# Patient Record
Sex: Female | Born: 1940 | Race: White | Hispanic: No | Smoking: Never smoker
Health system: Southern US, Community
[De-identification: ages and names within clinical notes are randomized; demographics above are authoritative.]

## PROBLEM LIST (undated history)

## (undated) DIAGNOSIS — Z8679 Personal history of other diseases of the circulatory system: Secondary | ICD-10-CM

## (undated) DIAGNOSIS — Z0000 Encounter for general adult medical examination without abnormal findings: Secondary | ICD-10-CM

## (undated) DIAGNOSIS — E785 Hyperlipidemia, unspecified: Secondary | ICD-10-CM

## (undated) HISTORY — DX: Hyperlipidemia, unspecified: E78.5

## (undated) HISTORY — DX: Personal history of other diseases of the circulatory system: Z86.79

## (undated) HISTORY — DX: Encounter for general adult medical examination without abnormal findings: Z00.00

---

## 1997-06-21 HISTORY — PX: APPENDECTOMY: SHX54

## 1998-11-04 ENCOUNTER — Encounter (INDEPENDENT_AMBULATORY_CARE_PROVIDER_SITE_OTHER): Payer: Self-pay | Admitting: Gastroenterology

## 1999-08-27 ENCOUNTER — Encounter (INDEPENDENT_AMBULATORY_CARE_PROVIDER_SITE_OTHER): Payer: Self-pay | Admitting: Specialist

## 1999-08-27 ENCOUNTER — Other Ambulatory Visit: Admission: RE | Admit: 1999-08-27 | Discharge: 1999-08-27 | Payer: Self-pay | Admitting: Gastroenterology

## 2004-05-04 ENCOUNTER — Ambulatory Visit: Payer: Self-pay | Admitting: Gastroenterology

## 2004-05-11 ENCOUNTER — Ambulatory Visit: Payer: Self-pay | Admitting: Gastroenterology

## 2007-09-04 ENCOUNTER — Ambulatory Visit: Payer: Self-pay | Admitting: Internal Medicine

## 2007-09-06 DIAGNOSIS — E785 Hyperlipidemia, unspecified: Secondary | ICD-10-CM | POA: Insufficient documentation

## 2007-09-06 DIAGNOSIS — N816 Rectocele: Secondary | ICD-10-CM | POA: Insufficient documentation

## 2007-09-06 DIAGNOSIS — Z8679 Personal history of other diseases of the circulatory system: Secondary | ICD-10-CM | POA: Insufficient documentation

## 2007-09-06 DIAGNOSIS — D126 Benign neoplasm of colon, unspecified: Secondary | ICD-10-CM | POA: Insufficient documentation

## 2008-01-04 ENCOUNTER — Ambulatory Visit (HOSPITAL_COMMUNITY): Admission: RE | Admit: 2008-01-04 | Discharge: 2008-01-04 | Payer: Self-pay | Admitting: Cardiovascular Disease

## 2008-10-14 ENCOUNTER — Encounter: Payer: Self-pay | Admitting: Cardiovascular Disease

## 2008-10-14 LAB — CONVERTED CEMR LAB
AST: 25 units/L
Alkaline Phosphatase: 46 units/L
Calcium: 9.5 mg/dL
Chloride: 105 meq/L
Cholesterol: 198 mg/dL
Glucose, Bld: 94 mg/dL
HCT: 40.1 %
Hemoglobin: 14.4 g/dL
LDL Cholesterol: 114.2 mg/dL
MCV: 92.2 fL
Monocytes Relative: 7.4 %
Potassium: 4.8 meq/L
RBC: 4.35 M/uL

## 2009-03-26 ENCOUNTER — Encounter (INDEPENDENT_AMBULATORY_CARE_PROVIDER_SITE_OTHER): Payer: Self-pay | Admitting: *Deleted

## 2009-04-28 ENCOUNTER — Encounter: Payer: Self-pay | Admitting: Cardiovascular Disease

## 2009-07-08 ENCOUNTER — Encounter: Payer: Self-pay | Admitting: Cardiovascular Disease

## 2009-10-22 ENCOUNTER — Encounter: Payer: Self-pay | Admitting: Cardiovascular Disease

## 2009-10-31 ENCOUNTER — Telehealth: Payer: Self-pay | Admitting: Internal Medicine

## 2009-11-05 ENCOUNTER — Telehealth: Payer: Self-pay | Admitting: Internal Medicine

## 2009-11-19 ENCOUNTER — Ambulatory Visit: Payer: Self-pay | Admitting: Cardiovascular Disease

## 2009-11-20 ENCOUNTER — Telehealth: Payer: Self-pay | Admitting: Cardiovascular Disease

## 2009-11-24 ENCOUNTER — Encounter: Payer: Self-pay | Admitting: Cardiovascular Disease

## 2010-01-01 ENCOUNTER — Encounter: Payer: Self-pay | Admitting: Cardiovascular Disease

## 2010-07-21 NOTE — Progress Notes (Signed)
Summary: Schedule Colonoscopy  Phone Note Outgoing Call Call back at [facility] Phone [phone]   Call placed by: Harlow Mares CMA Duncan Dull),  Oct 31, 2009 2:57 PM Call placed to: Patient Summary of Call: Left message on patients machine to call back. patient due for colonoscopy Initial call taken by: Harlow Mares CMA Duncan Dull),  Oct 20, 2009 2:57 PM  Follow-up for Phone Call        Left message on patients machine to call back. patient due for colonoscopy. We will mail a letter to remind patient.  Follow-up by: Harlow Mares CMA Duncan Dull),  Oct 31, 2009 2:59 PM     Appended Document: Schedule Colonoscopy Patient changed care to [facility]

## 2010-07-21 NOTE — Consult Note (Signed)
Summary: OFFICE NOTE SE  OFFICE NOTE SE   Imported By: Park Breed 11/24/2009 09:37:58  _____________________________________________________________________  External Attachment:    Type:   Image     Comment:   External Document

## 2010-07-21 NOTE — Letter (Signed)
Summary: HEALTH HX  HEALTH HX   Imported ByFrazier Butt Chriscoe 11/24/2009 09:41:34  _____________________________________________________________________  External Attachment:    Type:   Image     Comment:   External Document

## 2010-07-21 NOTE — Progress Notes (Signed)
Summary: Going to [facility] for he Colonoscopy from now on  Phone Note Call from Patient Call back at [facility] Phone [phone]   Call For: Dr Juanda Chance Summary of Call: Wants to let us know she already had her Recall Colon at [facility] and will continue to go to [facility] from now on.  Initial call taken by: Leanor Kail [facility],  Nov 05, 2009 11:32 AM  Follow-up for Phone Call        change of care noted. Follow-up by: Lamona Curl CMA Duncan Dull),  Nov 05, 2009 11:36 AM

## 2010-07-21 NOTE — Letter (Signed)
Summary: Letter  Letter   Imported By: West Carbo 12/11/2009 11:57:58  _____________________________________________________________________  External Attachment:    Type:   Image     Comment:   External Document

## 2010-07-21 NOTE — Letter (Signed)
Summary: Medical Record Release  Medical Record Release   Imported By: Harlon Flor 10/29/2009 14:15:20  _____________________________________________________________________  External Attachment:    Type:   Image     Comment:   External Document

## 2010-07-21 NOTE — Assessment & Plan Note (Signed)
Summary: NP6   Visit Type:  Initial Consult Primary Provider:  Aletha Halim, M.D.  CC:  Surgical clearance.  History of Present Illness: Ms. Peralta is a very pleasant 70 year old woman with a strong family history of coronary artery disease (her father), with a coronary calcium score of zero in 2009, history of hyperlipidemia who presents to reestablish care. She was last seen at [facility] in November 2010.  This patient states that she is doing very well. she exercises frequently and watches what she eats. She has been taking cholestyramine for some rectal incontinence and she has a underlying diagnosis of rectal prolapse. She denies any significant shortness of breath, chest pain.  She is due to travel to the [facility] in July of this year for surgery to repair her rectal prolapse.  labs from January 2011 show hematocrit of 40, total cholesterol 187, LDL 102, HDL 75, normal LFTs. CRP 0.4.  NMR lipoprotein of shows LDL particle  number of 839, small LDL particle # of 105 with goal less than 500   Preventive Screening-Counseling & Management  Alcohol-Tobacco     Smoking Status: never  Caffeine-Diet-Exercise     Does Patient Exercise: yes      Drug Use:  no.    Allergies (verified): 1)  ! * Shingles Vaccine  Family History: Family History of Coronary Artery Disease:   Social History: Housewife Married  Tobacco Use - No.  Alcohol Use - yes 4-6 glasses q week Regular Exercise - yes--aerobic/strengh/yoga Drug Use - no Smoking Status:  never Does Patient Exercise:  yes Drug Use:  no  Review of Systems  The patient denies fever, weight loss, weight gain, vision loss, decreased hearing, hoarseness, chest pain, syncope, dyspnea on exertion, peripheral edema, prolonged cough, abdominal pain, incontinence, muscle weakness, depression, and enlarged lymph nodes.    Vital Signs:  Patient profile:   70 year old female Height:      66 inches Weight:       138 pounds BMI:     22.35 Pulse rate:   52 / minute BP sitting:   172 / 94  (right arm) Cuff size:   regular  Vitals Entered By: Bishop Dublin, CMA (November 19, 2009 3:02 PM)  Physical Exam  General:  Well developed, well nourished, in no acute distress. Head:  normocephalic and atraumatic Neck:  Neck supple, no JVD. No masses, thyromegaly or abnormal cervical nodes. Lungs:  Clear bilaterally to auscultation and percussion. Heart:  Non-displaced PMI, chest non-tender; regular rate and rhythm, S1, S2 without murmurs, rubs or gallops. Carotid upstroke normal, no bruit. . Pedals normal pulses. No edema, no varicosities. Abdomen:  Bowel sounds positive; abdomen soft and non-tender without masses, organomegaly, or hernias noted. No hepatosplenomegaly. Msk:  Back normal, normal gait. Muscle strength and tone normal. Pulses:  pulses normal in all 4 extremities Extremities:  No clubbing or cyanosis. Neurologic:  Alert and oriented x 3. Skin:  Intact without lesions or rashes. Psych:  Normal affect.   New Orders:     1)  EKG w/ Interpretation (93000)   EKG  Procedure date:  11/19/2009  Findings:      sinus bradycardia with rate 52 beats per minute, no significant ST or T wave changes  Impression & Recommendations:  Problem # 1:  PREVENTIVE HEALTH CARE (ICD-V70.0) Ms. Kundert would be at low risk of any complications for her upcoming rectal prolapse surgery at the [facility]. Her cholesterol is under reasonable  control with diet and exercise alone as well as with cholestyramine. She had a calcium score of zero in 2009 which puts her at low risk.  She is currently on a baby aspirin for stroke and MI prevention. She has asymptomatic bradycardia. blood pressure was well-controlled on recheck during her visit today.  Problem # 2:  MITRAL VALVE PROLAPSE, HX OF (ICD-V12.50) she carries a history of mitral valve prolapse though this has not been confirmed by echocardiography. She has no  murmur on exam. no further workup is indicated at this time.  Orders: EKG w/ Interpretation (93000)  Patient Instructions: 1)  Your physician recommends that you continue on your current medications as directed. Please refer to the Current Medication list given to you today. 2)  Your physician wants you to follow-up in:   1 YEAR  You will receive a reminder letter in the mail two months in advance. If you don't receive a letter, please call our office to schedule the follow-up appointment.

## 2010-07-21 NOTE — Progress Notes (Signed)
Summary: BP  Phone Note Call from Patient Call back at [phone]   Caller: SELF Call For: [facility] Summary of Call: PT WANTS TO KNOW IF SHE SHOULD BE CONCERNED ABOUT HER BP AT THE APPT YESTERDAY Initial call taken by: Harlon Flor,  November 20, 2009 9:09 AM  Follow-up for Phone Call        Per Dr. Mariah Milling pt instructed to take bps several times per day for 4 days-if bps are still elevated to call the office with her results. Follow-up by: Benedict Needy, RN,  November 20, 2009 12:59 PM

## 2010-11-03 NOTE — Assessment & Plan Note (Signed)
[facility]                         GASTROENTEROLOGY OFFICE NOTE   JACQUESE, CASSARINO                       MRN:          [id number]  DATE:09/04/2007                            DOB:          07/18/1940    This patient is a very nice 70 year old white female who is here today  because of stool incontinence.  She feels there is a prolapse of the  rectal tissue through the rectum, especially when she runs or walks.  She has been a very active exercise fanatic, walking or running, on a  daily basis.  Over the past 4 to 5 years, she has noticed some tissue  that prolapses through the rectum but spontaneously reduces itself.  This happens only periodically, not every day.  On occasion she has  stool leaking out without any warning.  She has a usually normal bowel  movement in the morning without any difficulty with evacuation.  Her  mother has a similar problem with leakage of the stool, and her older  sister has the same problem as well.  Ms. Barasch had numerous  colonoscopies by Dr. Victorino Dike who just retired because of family  history of colon cancer in her parents.  First colonoscopy, 1980, was  normal.  Subsequent colonoscopies in 2001 and 2002 showed adenomatous  polyps of the colon, and the last colonoscopy November 2005, she again  had a rather tortuous and redundant sigmoid colon, but no polyps.  She  is due for repeat colonoscopy in November of 2010.   MEDICATIONS:  None.   PAST HISTORY:  Appendectomy 2003.   FAMILY HISTORY:  Positive for colon cancer.   SOCIAL HISTORY:  Married with 2 children.  She does not smoke, drinks  alcohol only occasionally.   REVIEW OF SYSTEMS:  Essentially negative.  She denies any urinary  symptoms or gynecological symptoms.  She has had yearly checkup by Dr.  Reuel Derby at [facility] at [facility] for gynecological followup, last appointment  November 2008.  She did not complain of any rectal problems to him.   PHYSICAL  EXAMINATION:  Blood pressure 130/80, pulse 68 and weight 142  pounds.  She was alert, oriented, and appearing younger than her stated age.  LUNGS:  Clear to auscultation.  COR:  Normal S1, normal S2.  ABDOMEN:  Soft with good muscular support.  No tenderness.  Normoactive  bowel sounds.  No distension.  Liver edge at costal margin.  RECTAL/ANOSCOPIC EXAM:  Shows normal perianal area, markedly decreased  rectal sphincter tone.  Anoscope causes prolapse of the small part of  the rectal ampulla through the rectal os, but reduces itself  spontaneously.  Stool was Hemoccult negative.  Rectal/vaginal wall is  rather generous.   IMPRESSION:  64. A 70 year old white female, physically very active, who is having      symptoms of rectocele and pelvic relaxation with some prolapse of      the rectal tissue.  Because of the running and jogging she does, it      is quite uncomfortable and inconvenient to her and she would like  to have this corrected.  2. Positive family history of colon cancer in a direct relative.  3. Personal history of adenomatous polyp of the colon in 2002 and 2003      with a history of redundant colon.   PLAN:  1. Continue high-fiber diet.  2. Continue physical activity and pelvic floor muscle exercises.  3. I would like to refer her back to Dr. Reuel Derby who may refer her to      Dr. Ferne Coe, general surgery, for assessment of rectal incontinence      and prolapse.  She may benefit from surgical procedure to improve      her bowel function.  4. She will need a colonoscopy next year.  If her surgeons feel that      she needs to have a colonoscopy prior to any repair of her rectum,      we will be happy to do that prior to that.     Hedwig Morton. Juanda Chance, MD  Electronically Signed    DMB/MedQ  DD: 09/04/2007  DT: 09/04/2007  Job #: [id number]   cc:   Marguarite Arbour, MD

## 2010-12-24 ENCOUNTER — Ambulatory Visit: Payer: Self-pay | Admitting: Cardiovascular Disease

## 2011-01-15 ENCOUNTER — Encounter: Payer: Self-pay | Admitting: Cardiovascular Disease

## 2011-01-21 ENCOUNTER — Ambulatory Visit (INDEPENDENT_AMBULATORY_CARE_PROVIDER_SITE_OTHER): Payer: Medicare Other | Admitting: Cardiovascular Disease

## 2011-01-21 ENCOUNTER — Encounter: Payer: Self-pay | Admitting: Cardiovascular Disease

## 2011-01-21 DIAGNOSIS — E785 Hyperlipidemia, unspecified: Secondary | ICD-10-CM

## 2011-01-21 DIAGNOSIS — R42 Dizziness and giddiness: Secondary | ICD-10-CM

## 2011-01-21 DIAGNOSIS — Z8679 Personal history of other diseases of the circulatory system: Secondary | ICD-10-CM

## 2011-01-21 NOTE — Patient Instructions (Signed)
You are doing well. No medication changes were made. We will order a carotid ultrasound for dizziness Please call us if you have new issues that need to be addressed before your next appt.  We will call you for a follow up Appt. In 12 months

## 2011-01-21 NOTE — Assessment & Plan Note (Addendum)
Episodes of dizziness, symptoms suggestive of orthostasis. She is very concerned about underlying carotid disease given her symptoms. We have ordered a carotid ultrasound at her request.  She does have a low heart rate likely secondary to her regular exercise. I've asked her to continue adequate hydration, liberalize her salt intake as she does report a low blood pressure.

## 2011-01-21 NOTE — Progress Notes (Signed)
Patient ID: Kristina Haney, female    DOB: 12-31-1940, 70 y.o.   MRN: [id number]  HPI Comments: Kristina Haney is a very pleasant 70 year old woman with a strong family history of coronary artery disease (her father), with a coronary calcium score of zero in 2009, history of borderline hyperlipidemia who presents 70 routine followup. She has been taking cholestyramine for some rectal incontinence and she has a underlying diagnosis of rectal prolapse. S/p  surgery to repair her rectal prolapse 12/2009.   This patient states that she is doing very well. she exercises frequently and watches what she eats. She denies any significant shortness of breath. She does report a recent episode of chest tightness or burning. She describes it as a "funny feeling in her chest". This occurred while she was in her car and lasted for 5-10 minutes before resolving. She does not have any chest symptoms when she exercises. She exercises on a daily basis. She had a similar sensation 6 months ago.  She also reports that she has some lightheadedness when she stands on a periodic basis. She reports that she does drink significant fluids. She does not pass out. She has to stop what she is doing for her symptoms resolved and then she can proceed with walking. She is very concerned about her carotid arteries.    labs from January 2011 show hematocrit of 40, total cholesterol 187, LDL 102, HDL 75, normal LFTs. CRP 0.4.  NMR lipoprotein of shows LDL particle  number of 839, small LDL particle # of 105 with goal less than 500  Most recent blood work from July of this year showed total cholesterol 187, LDL 106, HDL 65, CRP 0.5.  EKG shows normal sinus rhythm with rate 54 beats per minute with no significant ST or T wave changes      Outpatient Encounter Prescriptions as of 8/68/2012  Medication Sig Dispense Refill  . aspirin 81 MG tablet Take 81 mg by mouth daily.        . Biotin 1000 MCG tablet Take 1,000 mcg by mouth 3 (three)  times daily.        . Calcium Carbonate-Vit D-Min 600-400 MG-UNIT TABS Take by mouth daily.        . Cholecalciferol (VITAMIN D3) 2000 UNITS capsule Take 2,000 Units by mouth daily.        . Fish Oil OIL by Does not apply route daily.        . Flax OIL Take by mouth daily.        . vitamin E 400 UNIT capsule Take 400 Units by mouth daily.          Review of Systems  Constitutional: Negative.   HENT: Negative.   Eyes: Negative.   Respiratory: Negative.   Cardiovascular: Positive for chest pain.  Gastrointestinal: Negative.   Musculoskeletal: Negative.   Skin: Negative.   Neurological: Positive for dizziness.  Hematological: Negative.   Psychiatric/Behavioral: Negative.   All other systems reviewed and are negative.    BP 133/89  Pulse 59  Ht 5\' 6"  (1.676 m)  Wt 135 lb (61.236 kg)  BMI 21.79 kg/m2  Physical Exam  Nursing note and vitals reviewed. Constitutional: She is oriented to person, place, and time. She appears well-developed and well-nourished.  HENT:  Head: Normocephalic.  Nose: Nose normal.  Mouth/Throat: Oropharynx is clear and moist.  Eyes: Conjunctivae are normal. Pupils are equal, round, and reactive to light.  Neck: Normal range of motion. Neck supple.  No JVD present.  Cardiovascular: Normal rate, regular rhythm, S1 normal, S2 normal, normal heart sounds and intact distal pulses.  Exam reveals no gallop and no friction rub.   No murmur heard. Pulmonary/Chest: Effort normal and breath sounds normal. No respiratory distress. She has no wheezes. She has no rales. She exhibits no tenderness.  Abdominal: Soft. Bowel sounds are normal. She exhibits no distension. There is no tenderness.  Musculoskeletal: Normal range of motion. She exhibits no edema and no tenderness.  Lymphadenopathy:    She has no cervical adenopathy.  Neurological: She is alert and oriented to person, place, and time. Coordination normal.  Skin: Skin is warm and dry. No rash noted. No erythema.   Psychiatric: She has a normal mood and affect. Her behavior is normal. Judgment and thought content normal.         Assessment and Plan

## 2011-01-21 NOTE — Assessment & Plan Note (Signed)
Questionable mitral valve prolapse history. No clear mitral regurgitation or signs of heart failure from MR on clinical exam. We will hold off on repeat echocardiogram at this time.

## 2011-01-21 NOTE — Assessment & Plan Note (Signed)
Blood pressure is adequate though slightly elevated from previous numbers last year. She does have a strong family history though no coronary calcification seen on CT scan 3 years ago.

## 2011-01-26 ENCOUNTER — Encounter: Payer: PRIVATE HEALTH INSURANCE | Admitting: *Deleted

## 2011-02-09 ENCOUNTER — Encounter (INDEPENDENT_AMBULATORY_CARE_PROVIDER_SITE_OTHER): Payer: Medicare Other | Admitting: *Deleted

## 2011-02-09 DIAGNOSIS — R42 Dizziness and giddiness: Secondary | ICD-10-CM

## 2011-02-16 ENCOUNTER — Encounter: Payer: Self-pay | Admitting: Cardiovascular Disease

## 2011-08-10 ENCOUNTER — Ambulatory Visit: Payer: Self-pay | Admitting: Internal Medicine

## 2011-12-31 ENCOUNTER — Encounter: Payer: Self-pay | Admitting: Internal Medicine

## 2012-01-05 ENCOUNTER — Telehealth: Payer: Self-pay | Admitting: Internal Medicine

## 2012-01-05 NOTE — Telephone Encounter (Signed)
Transferred GI Care to [facility].

## 2012-04-21 ENCOUNTER — Ambulatory Visit: Payer: Self-pay | Admitting: Internal Medicine

## 2012-12-26 IMAGING — RF DG UGI W/O KUB
5 of 6 series · 15 of 20 positions shown · non-contrast
Comparison: none

REASON FOR EXAM: reflux  hoarseness
COMMENTS:

PROCEDURE:     FL  - FL UPPER GI  - August 10, 2011  [DATE]
RESULT:
PROCEDURE:  The patient was given effervescent crystals followed by
administration of oral barium. Spot images of the upper gastrointestinal
tract were obtained.

[Series 1: fluoro_barium 2fps_bw · 0.17mm/px · 1 of 1 slices shown (1 of 5)]
[im 1/1]
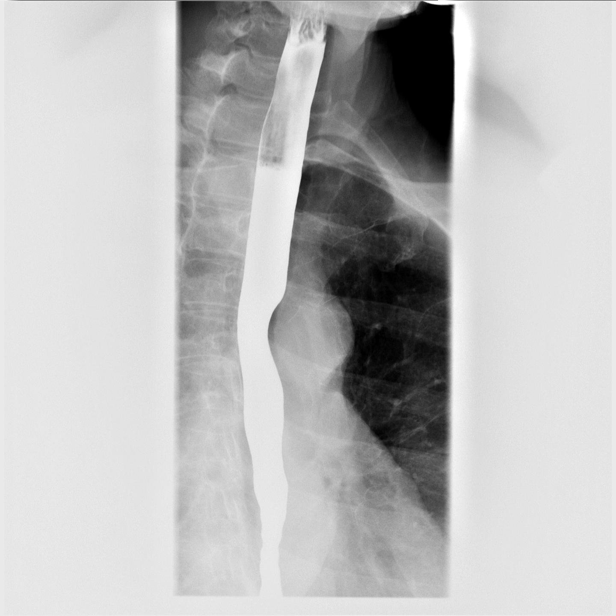

[Series 3: fluoro_barium 2fps_bw · 0.17mm/px · 1 of 1 slices shown (2 of 5)]
[im 1/1]
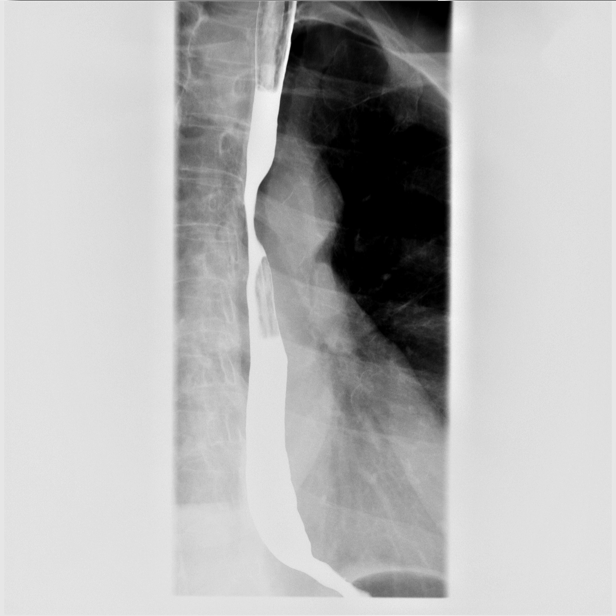

[Series 4: fluoro_barium 2fps_bw · 0.17mm/px · 1 of 1 slices shown (3 of 5)]
[im 1/1]
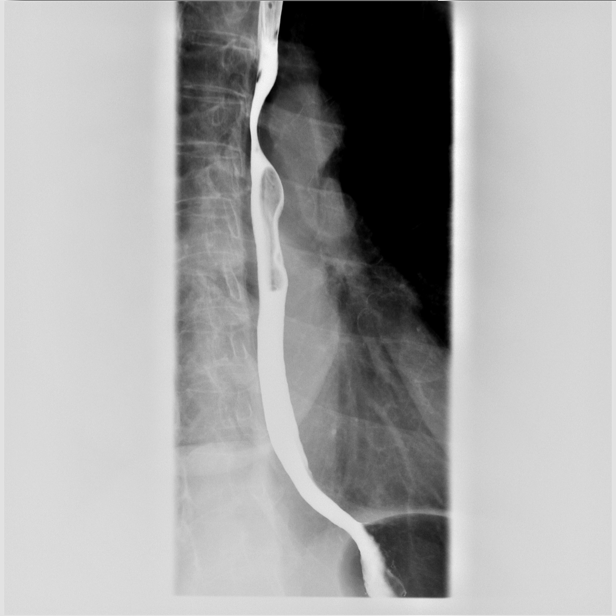

[Series 5: fluoro_barium 2fps_bw · 0.17mm/px · 1 of 2 frames shown (4 of 5)]
[frame 1/2]
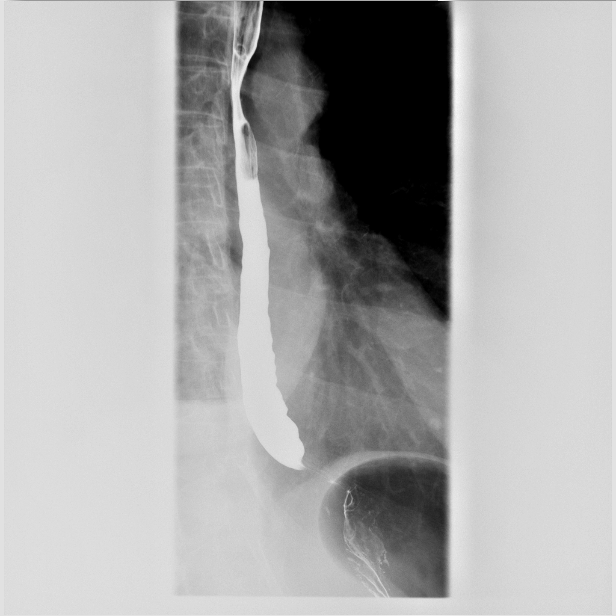

[Series 6: fluoro_barium 2fps_bw · 0.19mm/px · 11 of 14 slices shown (5 of 5)]
[im 1/14]
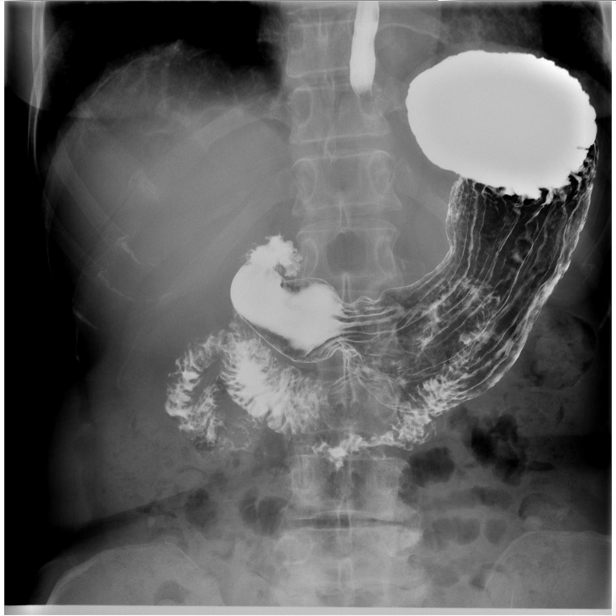
[im 2/14]
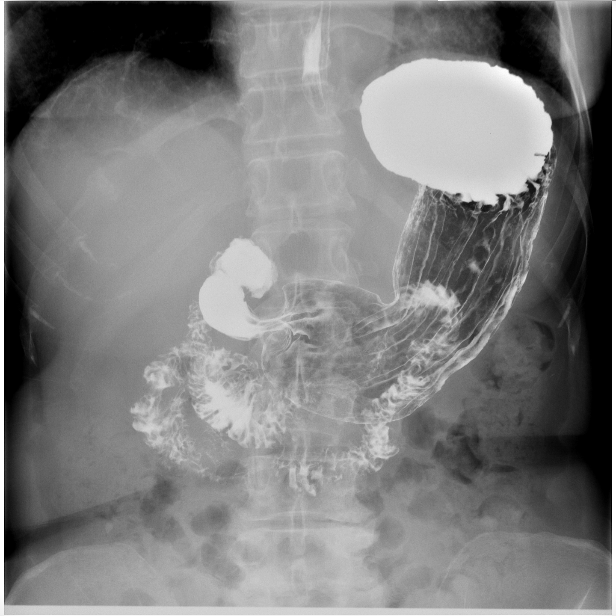
[im 3/14]
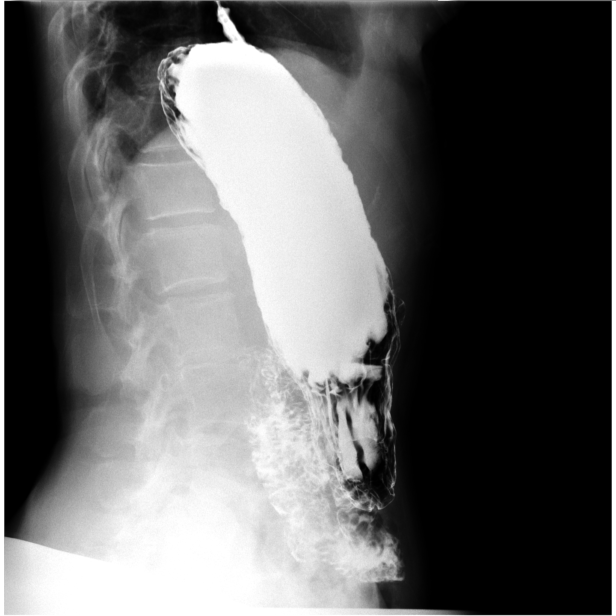
[im 5/14]
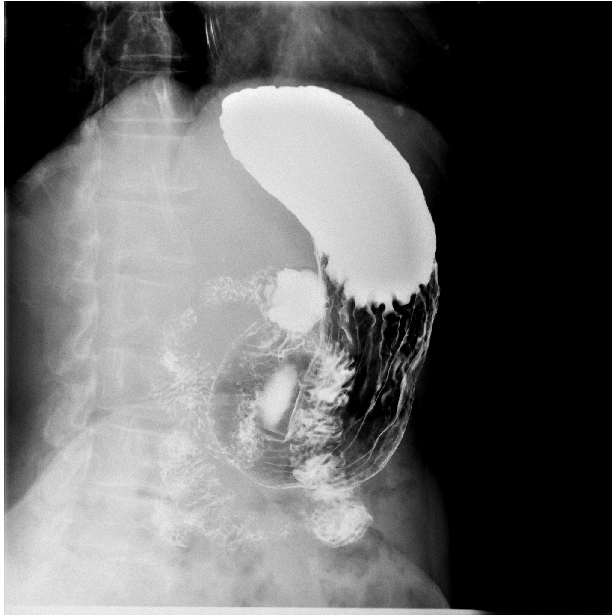
[im 6/14]
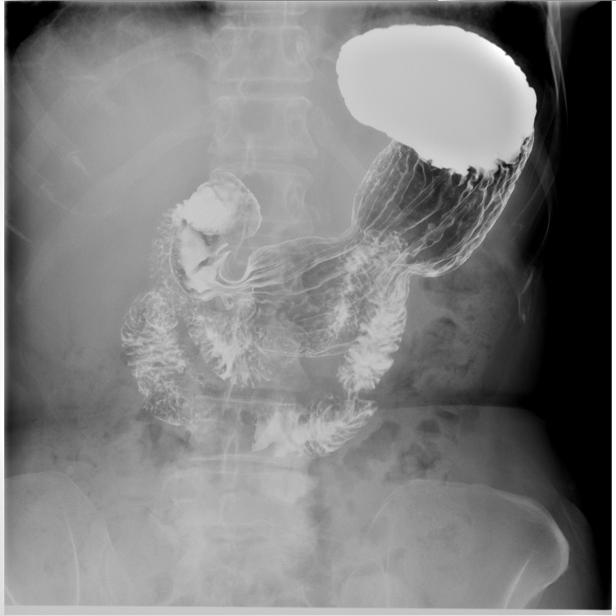
[im 7/14]
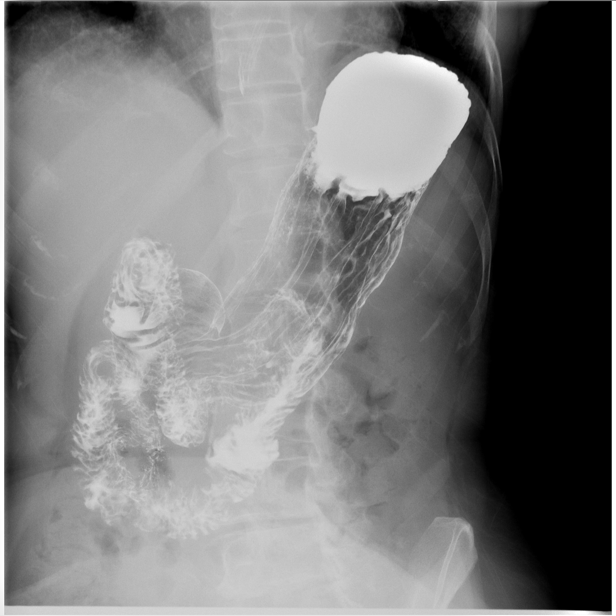
[im 9/14]
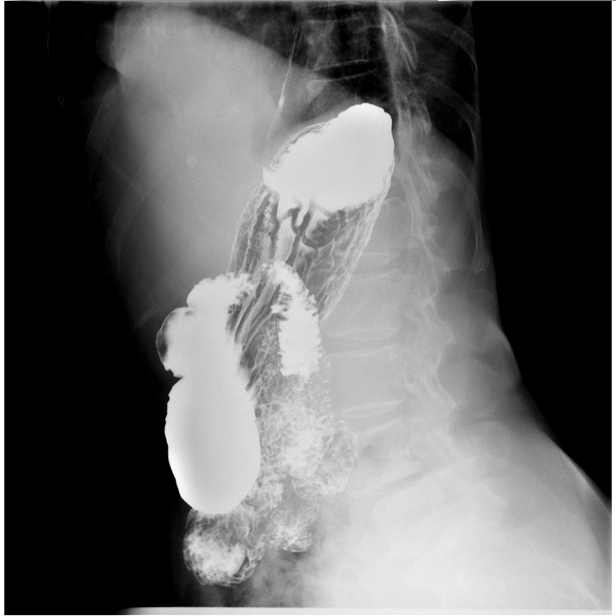
[im 10/14]
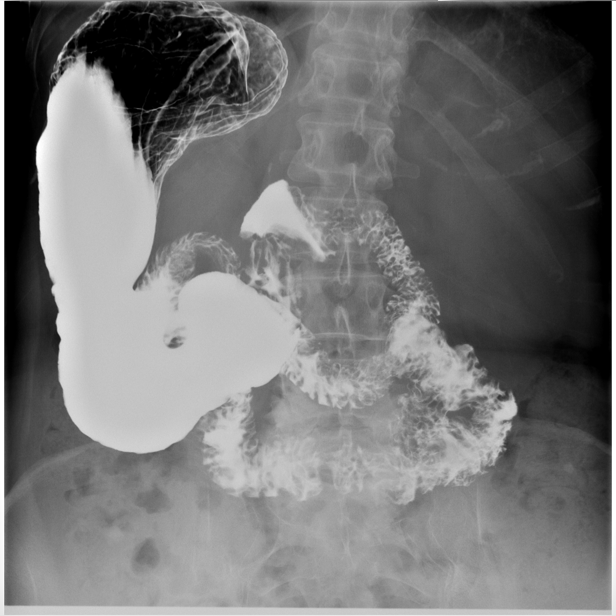
[im 11/14]
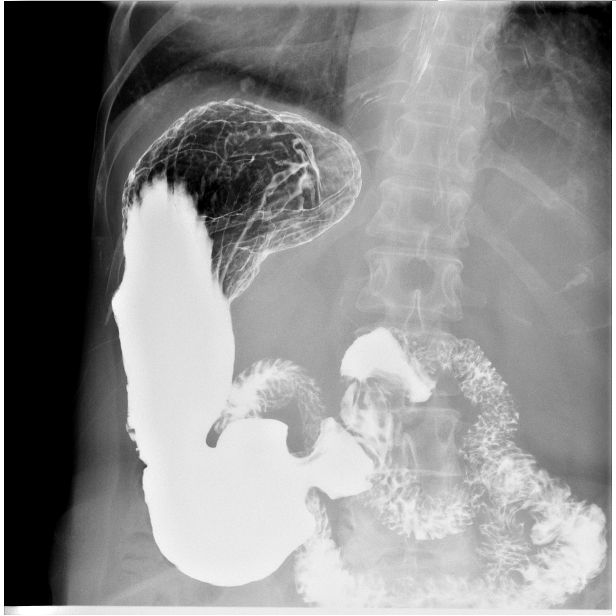
[im 13/14]
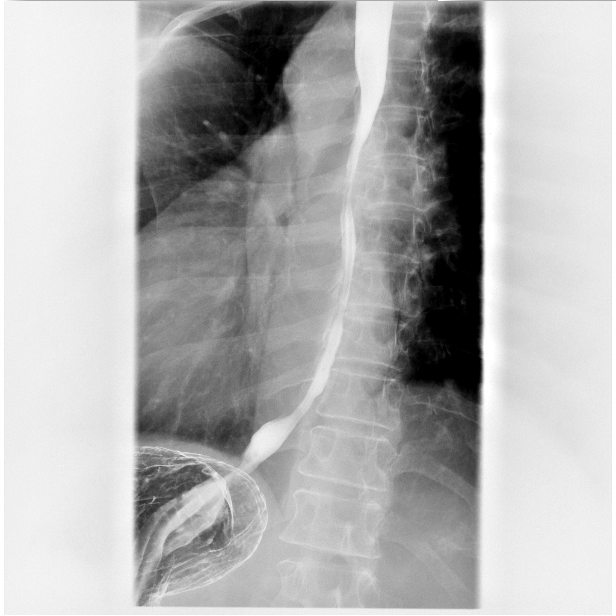
[im 14/14]
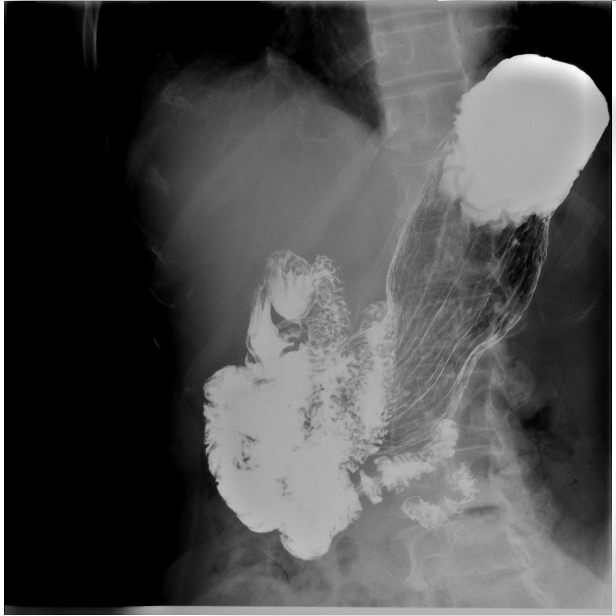

[15 of 20 positions shown; findings below may reference images not displayed]

FINDINGS: Limited evaluation of the esophagus is unremarkable. There is
appropriate relaxation of the lower esophageal sphincter. The stomach,
duodenum, and duodenal bulb are unremarkable. There is appropriate rotation
of the duodenum and replacement of the ligament of Treitz. There is no
evidence of gastroesophageal reflux or hiatal hernia.
IMPRESSION: Unremarkable Upper GI evaluation.

## 2013-01-29 ENCOUNTER — Ambulatory Visit (INDEPENDENT_AMBULATORY_CARE_PROVIDER_SITE_OTHER): Payer: Medicare Other | Admitting: Cardiovascular Disease

## 2013-01-29 ENCOUNTER — Encounter: Payer: Self-pay | Admitting: Cardiovascular Disease

## 2013-01-29 VITALS — BP 120/70 | HR 54 | Ht 66.0 in | Wt 137.5 lb

## 2013-01-29 DIAGNOSIS — Z0000 Encounter for general adult medical examination without abnormal findings: Secondary | ICD-10-CM

## 2013-01-29 DIAGNOSIS — E785 Hyperlipidemia, unspecified: Secondary | ICD-10-CM

## 2013-01-29 DIAGNOSIS — Z8679 Personal history of other diseases of the circulatory system: Secondary | ICD-10-CM

## 2013-01-29 NOTE — Assessment & Plan Note (Signed)
Calcium score is 0. Cholesterol is adequate. No significant carotid arterial disease on review of recent records. Negative stress test/stress echo. Other lab work looks essentially benign. Encouraged her to continue what she is doing with her high-level of exercise

## 2013-01-29 NOTE — Assessment & Plan Note (Signed)
She has records of stress echo showing no ischemia. Trace MR

## 2013-01-29 NOTE — Progress Notes (Signed)
Patient ID: AYLLA HUFFINE, female    DOB: Mar 08, 1941, 72 y.o.   MRN: [id number]  HPI Comments: Ms. Melberg is a very pleasant 72 year old woman with a strong family history of coronary artery disease (her father), with a coronary calcium score of zero in 2009 and again in 2014 , history of borderline hyperlipidemia who presents for routine followup.  underlying diagnosis of rectal prolapse. S/p  surgery to repair her rectal prolapse 12/2009.    This patient states that she is doing very well. she exercises frequently and watches what she eats. She denies any significant shortness of breath.  She does not have any chest symptoms when she exercises. She exercises on a daily basis.   She reports that she had an executive panel done. She brings these records with her today for our review.  This included extensive lab work, imaging. Everything was essentially normal. Total cholesterol 186, HDL 67, LDL 103  She had a stress echo that was normal Carotid ultrasound showed no significant plaque Coronary calcium score was 0   EKG shows normal sinus rhythm with rate 54 beats per minute with no significant ST or T wave changes      Outpatient Encounter Prescriptions as of 01/29/2013  Medication Sig Dispense Refill  . aspirin 81 MG tablet Take 81 mg by mouth daily.        . Calcium Carbonate-Vit D-Min 600-400 MG-UNIT TABS Take by mouth daily.        . Cholecalciferol (VITAMIN D3) 2000 UNITS capsule Take 2,000 Units by mouth daily.        . Fish Oil OIL by Does not apply route daily.        . Flax OIL Take by mouth daily.        Marland Kitchen thiamine (VITAMIN B-1) 100 MG tablet Take 100 mg by mouth daily.      . [DISCONTINUED] Biotin 1000 MCG tablet Take 1,000 mcg by mouth 3 (three) times daily.        . [DISCONTINUED] vitamin E 400 UNIT capsule Take 400 Units by mouth daily.          Review of Systems  Constitutional: Negative.   HENT: Negative.   Eyes: Negative.   Respiratory: Negative.   Cardiovascular:  Negative.   Gastrointestinal: Negative.   Musculoskeletal: Negative.   Skin: Negative.   Neurological: Negative.   Psychiatric/Behavioral: Negative.   All other systems reviewed and are negative.    BP 120/70  Pulse 54  Ht 5\' 6"  (1.676 m)  Wt 137 lb 8 oz (62.37 kg)  BMI 22.2 kg/m2   Physical Exam  Nursing note and vitals reviewed. Constitutional: She is oriented to person, place, and time. She appears well-developed and well-nourished.  HENT:  Head: Normocephalic.  Nose: Nose normal.  Mouth/Throat: Oropharynx is clear and moist.  Eyes: Conjunctivae are normal. Pupils are equal, round, and reactive to light.  Neck: Normal range of motion. Neck supple. No JVD present.  Cardiovascular: Normal rate, regular rhythm, S1 normal, S2 normal, normal heart sounds and intact distal pulses.  Exam reveals no gallop and no friction rub.   No murmur heard. Pulmonary/Chest: Effort normal and breath sounds normal. No respiratory distress. She has no wheezes. She has no rales. She exhibits no tenderness.  Abdominal: Soft. Bowel sounds are normal. She exhibits no distension. There is no tenderness.  Musculoskeletal: Normal range of motion. She exhibits no edema and no tenderness.  Lymphadenopathy:    She has no cervical  adenopathy.  Neurological: She is alert and oriented to person, place, and time. Coordination normal.  Skin: Skin is warm and dry. No rash noted. No erythema.  Psychiatric: She has a normal mood and affect. Her behavior is normal. Judgment and thought content normal.    Assessment and Plan

## 2013-01-29 NOTE — Assessment & Plan Note (Signed)
Encouraged her to stop fish oil and start red yeast rice

## 2013-01-29 NOTE — Patient Instructions (Addendum)
You are doing well. No medication changes were made.  Try Red Yeast Rice for cholesterol  Please call us if you have new issues that need to be addressed before your next appt.  Your physician wants you to follow-up in: 12 months.  You will receive a reminder letter in the mail two months in advance. If you don't receive a letter, please call our office to schedule the follow-up appointment.

## 2013-04-19 ENCOUNTER — Ambulatory Visit: Payer: Self-pay | Admitting: Internal Medicine

## 2013-04-26 ENCOUNTER — Other Ambulatory Visit: Payer: Self-pay

## 2013-05-01 ENCOUNTER — Other Ambulatory Visit (HOSPITAL_COMMUNITY): Payer: Self-pay | Admitting: Urology

## 2013-05-01 DIAGNOSIS — N1330 Unspecified hydronephrosis: Secondary | ICD-10-CM

## 2013-05-07 ENCOUNTER — Telehealth: Payer: Self-pay

## 2013-05-07 NOTE — Telephone Encounter (Signed)
Pt states she dropped lab results from Dr. Judithann Sheen office last week, and wants to make sure Dr Mariah Milling received it. Please call.

## 2013-05-08 ENCOUNTER — Encounter: Payer: Self-pay | Admitting: Cardiovascular Disease

## 2013-05-08 ENCOUNTER — Ambulatory Visit (INDEPENDENT_AMBULATORY_CARE_PROVIDER_SITE_OTHER): Payer: Medicare Other | Admitting: Cardiovascular Disease

## 2013-05-08 VITALS — BP 132/80 | HR 54 | Ht 66.0 in | Wt 137.8 lb

## 2013-05-08 DIAGNOSIS — E785 Hyperlipidemia, unspecified: Secondary | ICD-10-CM

## 2013-05-08 DIAGNOSIS — Z0000 Encounter for general adult medical examination without abnormal findings: Secondary | ICD-10-CM

## 2013-05-08 NOTE — Progress Notes (Signed)
Patient ID: Kristina Haney, female    DOB: 1941/01/13, 72 y.o.   MRN: [id number]  HPI Comments: Kristina Haney is a very pleasant 72 year old woman with a strong family history of coronary artery disease (her father), with a coronary calcium score of zero in 2009 and again in early 2014 , minimal carotid intimal thickening on carotid ultrasound (done outside the office), minimal aortic plaquing,  history of borderline hyperlipidemia who presents for routine followup. diagnosis of rectal prolapse. S/p  surgery to repair her rectal prolapse 12/2009.    This patient states that she is doing very well. she exercises frequently and watches what she eats. She denies any significant shortness of breath.  She does not have any chest symptoms when she exercises. She exercises on a daily basis.   she had an executive panel done. She brings these records with her today for our review.  This included extensive lab work, imaging. Everything was essentially normal. Total cholesterol 186, HDL 67, LDL 103  She had an NMR lipoprofile October 2014 suggested total cholesterol in the 240 range, LDL 130 range, lipoprotein (a) greater than 100. This was much different from her baseline that was done also in October 2014 which she had total cholesterol 190. Possible lab error?  Previous stress echo was normal  Outpatient Encounter Prescriptions as of 05/08/2013  Medication Sig  . Ascorbic Acid (VITAMIN C) 1000 MG tablet Take 1,000 mg by mouth daily.  Marland Kitchen aspirin 81 MG tablet Take 81 mg by mouth daily.    . Biotin 5000 MCG CAPS Take 5,000 mcg by mouth daily.  . Calcium 1500 MG tablet Take 1,500 mg by mouth daily.  . Cholecalciferol (VITAMIN D3) 2000 UNITS capsule Take 2,000 Units by mouth daily.    . Fish Oil OIL 1,000 mg by Does not apply route daily.   . Flax OIL Take by mouth daily.   . [DISCONTINUED] Calcium Carbonate-Vit D-Min 600-400 MG-UNIT TABS Take by mouth daily.    . [DISCONTINUED] thiamine (VITAMIN B-1) 100 MG  tablet Take 100 mg by mouth daily.     Review of Systems  Constitutional: Negative.   HENT: Negative.   Eyes: Negative.   Respiratory: Negative.   Cardiovascular: Negative.   Gastrointestinal: Negative.   Endocrine: Negative.   Musculoskeletal: Negative.   Skin: Negative.   Allergic/Immunologic: Negative.   Neurological: Negative.   Hematological: Negative.   Psychiatric/Behavioral: Negative.   All other systems reviewed and are negative.    BP 132/80  Pulse 54  Ht 5\' 6"  (1.676 m)  Wt 137 lb 12 oz (62.483 kg)  BMI 22.24 kg/m2   Physical Exam  Nursing note and vitals reviewed. Constitutional: She is oriented to person, place, and time. She appears well-developed and well-nourished.  HENT:  Head: Normocephalic.  Nose: Nose normal.  Mouth/Throat: Oropharynx is clear and moist.  Eyes: Conjunctivae are normal. Pupils are equal, round, and reactive to light.  Neck: Normal range of motion. Neck supple. No JVD present.  Cardiovascular: Normal rate, regular rhythm, S1 normal, S2 normal, normal heart sounds and intact distal pulses.  Exam reveals no gallop and no friction rub.   No murmur heard. Pulmonary/Chest: Effort normal and breath sounds normal. No respiratory distress. She has no wheezes. She has no rales. She exhibits no tenderness.  Abdominal: Soft. Bowel sounds are normal. She exhibits no distension. There is no tenderness.  Musculoskeletal: Normal range of motion. She exhibits no edema and no tenderness.  Lymphadenopathy:  She has no cervical adenopathy.  Neurological: She is alert and oriented to person, place, and time. Coordination normal.  Skin: Skin is warm and dry. No rash noted. No erythema.  Psychiatric: She has a normal mood and affect. Her behavior is normal. Judgment and thought content normal.    Assessment and Plan

## 2013-05-08 NOTE — Telephone Encounter (Signed)
Spoke w/ pt.  She is sched to come in this afternoon at 3:45 to discuss cholesterol and possible meds.

## 2013-05-08 NOTE — Assessment & Plan Note (Signed)
As above, we spent significant time talking about her lipids. She will recheck her cholesterol in 6 months time, consider repeat nmr lipoprofile in one years time (although she is relatively low risk).

## 2013-05-08 NOTE — Patient Instructions (Signed)
You are doing well. No medication changes were made.  Please call us if you have new issues that need to be addressed before your next appt.  Your physician wants you to follow-up in: 6 months.  You will receive a reminder letter in the mail two months in advance. If you don't receive a letter, please call our office to schedule the follow-up appointment.   

## 2013-05-08 NOTE — Assessment & Plan Note (Signed)
We have encouraged her to continue her healthy habits of eating and exercise. Imaging has shown no significant high-risk features. Carotids are normal, calcium score normal, aorta with no significant plaquing

## 2013-05-24 ENCOUNTER — Encounter (HOSPITAL_COMMUNITY): Payer: Self-pay

## 2013-05-24 ENCOUNTER — Encounter (HOSPITAL_COMMUNITY)
Admission: RE | Admit: 2013-05-24 | Discharge: 2013-05-24 | Disposition: A | Discharge status: Home / Self Care | Payer: Medicare Other | Source: Ambulatory Visit | Attending: Urology | Admitting: Urology

## 2013-05-24 DIAGNOSIS — N135 Crossing vessel and stricture of ureter without hydronephrosis: Secondary | ICD-10-CM | POA: Insufficient documentation

## 2013-05-24 DIAGNOSIS — N2889 Other specified disorders of kidney and ureter: Secondary | ICD-10-CM | POA: Insufficient documentation

## 2013-05-24 DIAGNOSIS — N1330 Unspecified hydronephrosis: Secondary | ICD-10-CM

## 2013-05-24 MED ORDER — FUROSEMIDE 10 MG/ML IJ SOLN
40.0000 mg | Freq: Once | INTRAMUSCULAR | Status: AC
  Administered 2013-05-24: 31 mg via INTRAVENOUS
  Filled 2013-05-24: qty 4

## 2013-05-24 MED ORDER — TECHNETIUM TC 99M MERTIATIDE
15.5000 | Freq: Once | INTRAVENOUS | Status: AC | PRN
Start: 2013-05-24 — End: 2013-05-24
  Administered 2013-05-24: 15.5 via INTRAVENOUS

## 2013-11-23 ENCOUNTER — Telehealth: Payer: Self-pay | Admitting: Cardiovascular Disease

## 2013-11-23 NOTE — Telephone Encounter (Signed)
Left message for pt to call back  °

## 2013-11-23 NOTE — Telephone Encounter (Signed)
Spoke w/ pt.  Advised pt of Dr. Donivan Scull recommendation.  She is agreeable and will try to make dietary changes before having labs w/ her PCP in a few months.

## 2013-11-23 NOTE — Telephone Encounter (Signed)
Recent lab work Nov 06 2013 showing total cholesterol 208, LDL 120, HDL 75.  LAD the patient know that this is slightly above her baseline which was typically in the 180s. Could consider adding Red Yeast Rice, oatmeal No indication of significant disease by previous calcium scoring and carotid studies Likely do not have to treat this very aggressively given those findings

## 2013-11-23 NOTE — Telephone Encounter (Signed)
Left message on voicemail for pt to call back

## 2014-02-28 ENCOUNTER — Ambulatory Visit: Payer: PRIVATE HEALTH INSURANCE | Admitting: Cardiovascular Disease

## 2014-03-05 ENCOUNTER — Ambulatory Visit: Payer: PRIVATE HEALTH INSURANCE | Admitting: Cardiovascular Disease

## 2014-03-19 ENCOUNTER — Ambulatory Visit: Payer: PRIVATE HEALTH INSURANCE | Admitting: Cardiovascular Disease

## 2014-03-20 ENCOUNTER — Ambulatory Visit (INDEPENDENT_AMBULATORY_CARE_PROVIDER_SITE_OTHER): Payer: Medicare Other | Admitting: Cardiovascular Disease

## 2014-03-20 ENCOUNTER — Encounter: Payer: Self-pay | Admitting: Cardiovascular Disease

## 2014-03-20 VITALS — BP 136/86 | HR 56 | Ht 66.0 in | Wt 135.8 lb

## 2014-03-20 DIAGNOSIS — Z0000 Encounter for general adult medical examination without abnormal findings: Secondary | ICD-10-CM

## 2014-03-20 DIAGNOSIS — E785 Hyperlipidemia, unspecified: Secondary | ICD-10-CM

## 2014-03-20 DIAGNOSIS — Z8679 Personal history of other diseases of the circulatory system: Secondary | ICD-10-CM

## 2014-03-20 NOTE — Assessment & Plan Note (Signed)
No significant prolapse click or murmur appreciated on today's visit

## 2014-03-20 NOTE — Patient Instructions (Signed)
You are doing well. No medication changes were made.  Please call us if you have new issues that need to be addressed before your next appt.  Your physician wants you to follow-up in: 12 months.  You will receive a reminder letter in the mail two months in advance. If you don't receive a letter, please call our office to schedule the follow-up appointment. 

## 2014-03-20 NOTE — Assessment & Plan Note (Addendum)
Currently not on a statin. She will recheck labs with primary care at the end of the year

## 2014-03-20 NOTE — Assessment & Plan Note (Signed)
Overall she is doing very well. Prior calcium score of 0, prior carotid ultrasound with minimal intimal thickening. No further testing needed at this time

## 2014-03-20 NOTE — Progress Notes (Signed)
Patient ID: Kristina Haney, female    DOB: 1940-07-31, 73 y.o.   MRN: [id number]  HPI Comments: Kristina Haney is a very pleasant 73 year old woman with a strong family history of coronary artery disease (her father), with a coronary calcium score of zero in 2009 and again in early 2014 , minimal carotid intimal thickening on carotid ultrasound (done outside the office), minimal aortic plaquing,  history of borderline hyperlipidemia who presents for routine followup. diagnosis of rectal prolapse. S/p  surgery to repair her rectal prolapse 12/2009.    This patient states that she is doing very well. she exercises frequently and watches what she eats. She denies any significant shortness of breath.  She does not have any chest symptoms when she exercises. She exercises on a daily basis.  She is scheduled to have routine lab work done in November 2015 primary care Most recent cholesterol earlier in may 2015 showed total cholesterol 208  She had an NMR lipoprofile October 2014 suggested total cholesterol in the 240 range, LDL 130 range, lipoprotein (a) greater than 100. This was much different from her baseline that was done also in October 2014 which she had total cholesterol 190. Possible lab error?  Previous stress echo was normal EKG shows normal sinus rhythm with no significant ST or T wave changes  Outpatient Encounter Prescriptions as of 03/20/2014  Medication Sig  . Ascorbic Acid (VITAMIN C) 1000 MG tablet Take 1,000 mg by mouth daily.  Marland Kitchen aspirin 81 MG tablet Take 81 mg by mouth daily.    Marland Kitchen b complex vitamins tablet Take 1 tablet by mouth daily.  . Biotin 5000 MCG CAPS Take 5,000 mcg by mouth daily.  . Calcium 1500 MG tablet Take 1,500 mg by mouth daily.  . Cholecalciferol (VITAMIN D3) 2000 UNITS capsule Take 2,000 Units by mouth daily.    . Fish Oil OIL 1,000 mg by Does not apply route daily.   . Flax OIL Take by mouth daily.   . Probiotic Product (ALIGN) 4 MG CAPS Take 4 mg by mouth daily.      Review of Systems  Constitutional: Negative.   HENT: Negative.   Eyes: Negative.   Respiratory: Negative.   Cardiovascular: Negative.   Gastrointestinal: Negative.   Endocrine: Negative.   Musculoskeletal: Negative.   Skin: Negative.   Allergic/Immunologic: Negative.   Neurological: Negative.   Hematological: Negative.   Psychiatric/Behavioral: Negative.   All other systems reviewed and are negative.   BP 136/86  Pulse 56  Ht 5\' 6"  (1.676 m)  Wt 135 lb 12 oz (61.576 kg)  BMI 21.92 kg/m2   Physical Exam  Nursing note and vitals reviewed. Constitutional: She is oriented to person, place, and time. She appears well-developed and well-nourished.  HENT:  Head: Normocephalic.  Nose: Nose normal.  Mouth/Throat: Oropharynx is clear and moist.  Eyes: Conjunctivae are normal. Pupils are equal, round, and reactive to light.  Neck: Normal range of motion. Neck supple. No JVD present.  Cardiovascular: Normal rate, regular rhythm, S1 normal, S2 normal, normal heart sounds and intact distal pulses.  Exam reveals no gallop and no friction rub.   No murmur heard. Pulmonary/Chest: Effort normal and breath sounds normal. No respiratory distress. She has no wheezes. She has no rales. She exhibits no tenderness.  Abdominal: Soft. Bowel sounds are normal. She exhibits no distension. There is no tenderness.  Musculoskeletal: Normal range of motion. She exhibits no edema and no tenderness.  Lymphadenopathy:    She has  no cervical adenopathy.  Neurological: She is alert and oriented to person, place, and time. Coordination normal.  Skin: Skin is warm and dry. No rash noted. No erythema.  Psychiatric: She has a normal mood and affect. Her behavior is normal. Judgment and thought content normal.    Assessment and Plan

## 2014-06-28 ENCOUNTER — Ambulatory Visit: Payer: Self-pay | Admitting: Internal Medicine

## 2014-12-16 ENCOUNTER — Other Ambulatory Visit: Payer: Self-pay

## 2015-03-04 ENCOUNTER — Ambulatory Visit: Payer: Medicare Other | Admitting: Cardiovascular Disease

## 2015-04-21 ENCOUNTER — Ambulatory Visit: Payer: Medicare Other | Admitting: Cardiovascular Disease

## 2015-05-01 ENCOUNTER — Other Ambulatory Visit: Payer: Self-pay | Admitting: Internal Medicine

## 2015-05-01 DIAGNOSIS — R1084 Generalized abdominal pain: Secondary | ICD-10-CM

## 2015-05-01 DIAGNOSIS — R102 Pelvic and perineal pain: Secondary | ICD-10-CM

## 2015-05-08 ENCOUNTER — Ambulatory Visit: Payer: Medicare Other

## 2015-05-09 ENCOUNTER — Ambulatory Visit: Payer: Medicare Other

## 2015-05-12 ENCOUNTER — Ambulatory Visit
Admission: RE | Admit: 2015-05-12 | Discharge: 2015-05-12 | Disposition: A | Discharge status: Home / Self Care | Payer: Medicare Other | Source: Ambulatory Visit | Attending: Internal Medicine | Admitting: Internal Medicine

## 2015-05-12 DIAGNOSIS — R1084 Generalized abdominal pain: Secondary | ICD-10-CM | POA: Insufficient documentation

## 2015-05-12 DIAGNOSIS — Z9889 Other specified postprocedural states: Secondary | ICD-10-CM | POA: Diagnosis not present

## 2015-05-12 DIAGNOSIS — N289 Disorder of kidney and ureter, unspecified: Secondary | ICD-10-CM | POA: Insufficient documentation

## 2015-05-12 DIAGNOSIS — R102 Pelvic and perineal pain: Secondary | ICD-10-CM

## 2015-05-12 DIAGNOSIS — K838 Other specified diseases of biliary tract: Secondary | ICD-10-CM | POA: Diagnosis not present

## 2015-05-12 MED ORDER — IOHEXOL 300 MG/ML  SOLN
50.0000 mL | Freq: Once | INTRAMUSCULAR | Status: AC | PRN
  Administered 2015-05-12: 100 mL via INTRAVENOUS

## 2015-05-20 ENCOUNTER — Ambulatory Visit: Payer: Medicare Other | Admitting: Cardiovascular Disease

## 2015-06-26 ENCOUNTER — Ambulatory Visit: Payer: Medicare Other | Admitting: Cardiovascular Disease

## 2015-07-02 ENCOUNTER — Ambulatory Visit (INDEPENDENT_AMBULATORY_CARE_PROVIDER_SITE_OTHER): Payer: Medicare Other | Admitting: Cardiovascular Disease

## 2015-07-02 ENCOUNTER — Encounter: Payer: Self-pay | Admitting: Cardiovascular Disease

## 2015-07-02 VITALS — BP 130/74 | HR 47 | Ht 66.0 in | Wt 133.5 lb

## 2015-07-02 DIAGNOSIS — Z8679 Personal history of other diseases of the circulatory system: Secondary | ICD-10-CM

## 2015-07-02 DIAGNOSIS — E785 Hyperlipidemia, unspecified: Secondary | ICD-10-CM | POA: Diagnosis not present

## 2015-07-02 DIAGNOSIS — I700 Atherosclerosis of aorta: Secondary | ICD-10-CM

## 2015-07-02 DIAGNOSIS — Z0000 Encounter for general adult medical examination without abnormal findings: Secondary | ICD-10-CM | POA: Diagnosis not present

## 2015-07-02 MED ORDER — ROSUVASTATIN CALCIUM 5 MG PO TABS: 5.0000 mg | ORAL_TABLET | Freq: Every day | ORAL | Status: DC

## 2015-07-02 NOTE — Assessment & Plan Note (Signed)
Total cholesterol 210, LDL 120, Given her Aortic atherosclerosis, we have suggested she treat this more aggressively Will start crestor 5mg  QOD, At a later date could consider increasing up to 5 mg daily

## 2015-07-02 NOTE — Assessment & Plan Note (Signed)
Long discussion going over her labs today, As well as CT scan images

## 2015-07-02 NOTE — Patient Instructions (Signed)
You are doing well.  Consider starting crestor one pill every other day as tolerated  Please call us if you have new issues that need to be addressed before your next appt.  Your physician wants you to follow-up in: 12 months.  You will receive a reminder letter in the mail two months in advance. If you don't receive a letter, please call our office to schedule the follow-up appointment.

## 2015-07-02 NOTE — Assessment & Plan Note (Signed)
CT scan images reviewed with her in detail showing mild, possibly mild to moderate distal aorta disease, Minimal atherosclerotic plaque in the right common iliac Options discussed with her Suggested she could try low-dose Crestor 5 mg, starting every other day She is unclear at this time if she would like to take medication on a daily basis Recommended if she does take Crestor routinely that we check her cholesterol in 3 months time

## 2015-07-02 NOTE — Progress Notes (Signed)
Patient ID: Kristina Haney, female    DOB: 1941/01/07, 75 y.o.   MRN: [id number]  HPI Comments: Kristina Haney is a very pleasant 75 year old woman with a strong family history of coronary artery disease (her father), with a coronary calcium score of zero in 2009 and again in early 2014 , minimal carotid intimal thickening on carotid ultrasound (done outside the office),  history of borderline hyperlipidemia who presents for routine followup of her hyperlipidemia. diagnosis of rectal prolapse. S/p  surgery to repair her rectal prolapse 12/2009.  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 She denies any significant shortness of breath. She exercises on a daily basis.   EKG on today's visit shows sinus bradycardia with rate 47 bpm, no significant ST or T-wave changes  The past medical history Previous stress echo was normal  Allergies  Allergen Reactions  . Iodinated Diagnostic Agents Rash    Can tolerate with a steroid prep  . Iohexol Hives    ivp dye  . Hydrocortisone Rash    Current Outpatient Prescriptions on File Prior to Visit  Medication Sig Dispense Refill  . Ascorbic Acid (VITAMIN C) 1000 MG tablet Take 1,000 mg by mouth daily.    Marland Kitchen aspirin 81 MG tablet Take 81 mg by mouth daily.      Marland Kitchen b complex vitamins tablet Take 1 tablet by mouth daily.    . Biotin 5000 MCG CAPS Take 5,000 mcg by mouth daily.    . Calcium 1500 MG tablet Take 1,500 mg by mouth daily.    . Cholecalciferol  (VITAMIN D3) 2000 UNITS capsule Take 2,000 Units by mouth daily.      . Flax OIL Take by mouth daily.      No current facility-administered medications on file prior to visit.    Past Medical History  Diagnosis Date  . Personal history of unspecified circulatory disease   . Routine general medical examination at a health care facility   . Hyperlipidemia     Past Surgical History  Procedure Laterality Date  . Appendectomy  1999    Social History  reports that she has never smoked. She does not have any smokeless tobacco history on file. She reports that she drinks about 0.6 oz of alcohol per week. She reports that she does not use illicit drugs.  Family History family history includes Heart Problems in her father and mother; Heart attack in her father; Stroke in her mother.    Review of Systems  Constitutional: Negative.   Respiratory: Negative.   Cardiovascular: Negative.   Gastrointestinal: Negative.   Musculoskeletal: Negative.   Allergic/Immunologic: Negative.   Neurological: Negative.   Hematological: Negative.   Psychiatric/Behavioral: Negative.   All other systems reviewed and are negative.   BP 130/74 mmHg  Pulse 47  Ht 5\' 6"  (1.676 m)  Wt 133 lb 8 oz (60.555 kg)  BMI 21.56 kg/m2  Physical Exam  Constitutional: She is oriented to person, place, and time. She appears  well-developed and well-nourished.  thin  HENT:  Head: Normocephalic.  Nose: Nose normal.  Mouth/Throat: Oropharynx is clear and moist.  Eyes: Conjunctivae are normal. Pupils are equal, round, and reactive to light.  Neck: Normal range of motion. Neck supple. No JVD present.  Cardiovascular: Normal rate, regular rhythm, S1 normal, S2 normal, normal heart sounds and intact distal pulses.  Exam reveals no gallop and no friction rub.   No murmur heard. Pulmonary/Chest: Effort normal and breath sounds normal. No respiratory distress. She has no wheezes. She has no rales. She exhibits no  tenderness.  Abdominal: Soft. Bowel sounds are normal. She exhibits no distension. There is no tenderness.  Musculoskeletal: Normal range of motion. She exhibits no edema or tenderness.  Lymphadenopathy:    She has no cervical adenopathy.  Neurological: She is alert and oriented to person, place, and time. Coordination normal.  Skin: Skin is warm and dry. No rash noted. No erythema.  Psychiatric: She has a normal mood and affect. Her behavior is normal. Judgment and thought content normal.    Assessment and Plan  Nursing note and vitals reviewed.

## 2015-09-01 ENCOUNTER — Other Ambulatory Visit (HOSPITAL_COMMUNITY): Payer: Self-pay | Admitting: Urology

## 2015-09-01 DIAGNOSIS — N135 Crossing vessel and stricture of ureter without hydronephrosis: Secondary | ICD-10-CM

## 2015-09-10 ENCOUNTER — Encounter (HOSPITAL_COMMUNITY)
Admission: RE | Admit: 2015-09-10 | Discharge: 2015-09-10 | Disposition: A | Discharge status: Home / Self Care | Payer: Medicare Other | Source: Ambulatory Visit | Attending: Urology | Admitting: Urology

## 2015-09-10 DIAGNOSIS — N135 Crossing vessel and stricture of ureter without hydronephrosis: Secondary | ICD-10-CM

## 2015-09-10 MED ORDER — FUROSEMIDE 10 MG/ML IJ SOLN
INTRAMUSCULAR | Status: AC
  Filled 2015-09-10: qty 4

## 2015-09-10 MED ORDER — TECHNETIUM TC 99M MERTIATIDE: 16.5000 | Freq: Once | INTRAVENOUS | Status: DC | PRN

## 2015-09-10 MED ORDER — FUROSEMIDE 10 MG/ML IJ SOLN
30.0000 mg | Freq: Once | INTRAMUSCULAR | Status: AC
  Administered 2015-09-10: 30 mg via INTRAVENOUS

## 2015-10-10 ENCOUNTER — Other Ambulatory Visit: Payer: Self-pay | Admitting: Internal Medicine

## 2015-10-10 DIAGNOSIS — R519 Headache, unspecified: Secondary | ICD-10-CM

## 2015-10-10 DIAGNOSIS — R51 Headache: Principal | ICD-10-CM

## 2015-10-15 ENCOUNTER — Ambulatory Visit
Admission: RE | Admit: 2015-10-15 | Discharge: 2015-10-15 | Disposition: A | Discharge status: Home / Self Care | Payer: Medicare Other | Source: Ambulatory Visit | Attending: Internal Medicine | Admitting: Internal Medicine

## 2015-10-15 DIAGNOSIS — R51 Headache: Secondary | ICD-10-CM | POA: Insufficient documentation

## 2015-10-15 DIAGNOSIS — R519 Headache, unspecified: Secondary | ICD-10-CM

## 2015-12-04 ENCOUNTER — Other Ambulatory Visit: Payer: Self-pay | Admitting: Internal Medicine

## 2015-12-04 DIAGNOSIS — R971 Elevated cancer antigen 125 [CA 125]: Secondary | ICD-10-CM

## 2016-01-07 ENCOUNTER — Ambulatory Visit
Admission: RE | Admit: 2016-01-07 | Discharge: 2016-01-07 | Disposition: A | Discharge status: Home / Self Care | Payer: Medicare Other | Source: Ambulatory Visit | Attending: Internal Medicine | Admitting: Internal Medicine

## 2016-01-07 DIAGNOSIS — R971 Elevated cancer antigen 125 [CA 125]: Secondary | ICD-10-CM | POA: Diagnosis present

## 2016-07-01 ENCOUNTER — Encounter: Payer: Self-pay | Admitting: Cardiovascular Disease

## 2016-07-01 ENCOUNTER — Ambulatory Visit (INDEPENDENT_AMBULATORY_CARE_PROVIDER_SITE_OTHER): Payer: Medicare Other | Admitting: Cardiovascular Disease

## 2016-07-01 VITALS — BP 122/80 | HR 56 | Ht 66.0 in | Wt 134.8 lb

## 2016-07-01 DIAGNOSIS — E782 Mixed hyperlipidemia: Secondary | ICD-10-CM

## 2016-07-01 DIAGNOSIS — Z0000 Encounter for general adult medical examination without abnormal findings: Secondary | ICD-10-CM

## 2016-07-01 DIAGNOSIS — I700 Atherosclerosis of aorta: Secondary | ICD-10-CM

## 2016-07-01 DIAGNOSIS — Z8679 Personal history of other diseases of the circulatory system: Secondary | ICD-10-CM

## 2016-07-01 DIAGNOSIS — E7889 Other lipoprotein metabolism disorders: Secondary | ICD-10-CM

## 2016-07-01 DIAGNOSIS — E7841 Elevated Lipoprotein(a): Secondary | ICD-10-CM | POA: Insufficient documentation

## 2016-07-01 MED ORDER — ROSUVASTATIN CALCIUM 5 MG PO TABS: 5.0000 mg | ORAL_TABLET | Freq: Every day | ORAL | 4 refills | Status: DC

## 2016-07-01 NOTE — Patient Instructions (Addendum)
Medication Instructions:   Please increase crestor up to 5 mg daily  Labwork:  No new labs needed  Testing/Procedures:  No further testing at this time   I recommend watching educational videos on topics of interest to you at:       [email or web address]  Enter code: [email or web address]    Follow-Up: It was a pleasure seeing you in the office today. Please call us if you have new issues that need to be addressed before your next appt.  [phone]  Your physician wants you to follow-up in: 12 months.  You will receive a reminder letter in the mail two months in advance. If you don't receive a letter, please call our office to schedule the follow-up appointment.  If you need a refill on your cardiac medications before your next appointment, please call your pharmacy.

## 2016-07-01 NOTE — Progress Notes (Signed)
Cardiology Office Note  Date:  07/01/2016   ID:  Kristina Haney, DOB 09/21/1940, MRN [id number]  PCP:  Idelle Crouch, MD   Chief Complaint  Patient presents with  . other    10mo f/u. Pt states she is doing well. Reviewed meds with pt verbally.    HPI:  Ms. Stinchfield is a very pleasant 76 year old woman with a strong family history of coronary artery disease (her father), with a coronary calcium score of zero in 2009 and again in early 2014 , minimal carotid intimal thickening on carotid ultrasound (done outside the office),  history of borderline hyperlipidemia who presents for routine followup of her hyperlipidemia. diagnosis of rectal prolapse. S/p  surgery to repair her rectal prolapse 12/2009. CT scan showing mild, sometimes mild to moderate descending aortic atherosclerosis  Lipoprotein (a) 140s  In follow-up she reports that she is feeling fine, active, works out at [employer] daily Denies any chest pain or shortness of breath concerning for ischemia Taking crestor 5 mg every other day, cholesterol has dropped 30-40 points Total chol 185 Lp (a) 144, no change in numbers  EKG on today's visit shows sinus bradycardia with rate 56 bpm, no significant ST or T-wave changes  Other past medical history reviewed Previous abdominal CT scan to rule out neoplastic growth  ( family history of pancreatic cancer) at least mild atherosclerotic plaque in the distal aorta (select areas with mild to moderate disease), minimal plaquing in the right common iliac  The past medical history Previous stress echo was normal   PMH:   has a past medical history of Hyperlipidemia; Personal history of unspecified circulatory disease; and Routine general medical examination at a health care facility.  PSH:    Past Surgical History:  Procedure Laterality Date  . APPENDECTOMY  1999    Current Outpatient Prescriptions  Medication Sig Dispense Refill  . Ascorbic Acid (VITAMIN C) 1000 MG tablet Take  1,000 mg by mouth daily.    Marland Kitchen aspirin 81 MG tablet Take 81 mg by mouth daily.      Marland Kitchen b complex vitamins tablet Take 1 tablet by mouth daily.    . Biotin 5000 MCG CAPS Take 5,000 mcg by mouth daily.    . Calcium 1500 MG tablet Take 1,500 mg by mouth daily.    . Cholecalciferol (VITAMIN D3) 2000 UNITS capsule Take 2,000 Units by mouth daily.      . Flax OIL Take by mouth daily.     . rosuvastatin (CRESTOR) 5 MG tablet Take 1 tablet (5 mg total) by mouth daily. 90 tablet 4   No current facility-administered medications for this visit.      Allergies:   Iodinated diagnostic agents; Iohexol; and Hydrocortisone   Social History:  The patient  reports that she has never smoked. She has never used smokeless tobacco. She reports that she drinks about 0.6 oz of alcohol per week . She reports that she does not use drugs.   Family History:   family history includes Heart Problems in her father and mother; Heart attack in her father; Stroke in her mother.    Review of Systems: Review of Systems  Constitutional: Negative.   Respiratory: Negative.   Cardiovascular: Negative.   Gastrointestinal: Negative.   Musculoskeletal: Negative.   Neurological: Negative.   Psychiatric/Behavioral: Negative.   All other systems reviewed and are negative.    PHYSICAL EXAM: VS:  BP 122/80 (BP Location: Left Arm, Patient Position: Sitting, Cuff Size: Normal)  Pulse (!) 56   Ht 5\' 6"  (1.676 m)   Wt 134 lb 12 oz (61.1 kg)   BMI 21.75 kg/m  , BMI Body mass index is 21.75 kg/m. GEN: Thin,  well developed, in no acute distress  HEENT: normal  Neck: no JVD, carotid bruits, or masses Cardiac: RRR; no murmurs, rubs, or gallops,no edema  Respiratory:  clear to auscultation bilaterally, normal work of breathing GI: soft, nontender, nondistended, + BS MS: no deformity or atrophy  Skin: warm and dry, no rash Neuro:  Strength and sensation are intact Psych: euthymic mood, full affect    Recent Labs: No  results found for requested labs within last 8760 hours.    Lipid Panel Lab Results  Component Value Date   CHOL 198 10/14/2008   HDL 71.0 10/14/2008   LDLCALC 114.2 10/14/2008   TRIG 64 10/14/2008      Wt Readings from Last 3 Encounters:  07/01/16 134 lb 12 oz (61.1 kg)  09/10/15 134 lb (60.8 kg)  07/02/15 133 lb 8 oz (60.6 kg)       ASSESSMENT AND PLAN:   Abdominal aortic atherosclerosis (HCC) Previously seen on CT scan. 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 No significant change since he has been on statin Implications discussed with her We'll continue aggressive management of lipids   Total encounter time more than 25 minutes  Greater than 50% was spent in counseling and coordination of care with the patient  Disposition:   F/U  12 months   Orders Placed This Encounter  Procedures  . EKG 12-Lead     Signed, Esmond Plants, M.D., Ph.D. 07/01/2016  [place] [place]

## 2016-10-12 ENCOUNTER — Encounter: Payer: Self-pay | Admitting: Cardiovascular Disease

## 2017-10-01 NOTE — Progress Notes (Signed)
Cardiology Office Note  Date:  10/03/2017   ID:  Kristina Haney, DOB 09-11-1940, MRN [id number]  PCP:  Kristina Crouch, MD   Chief Complaint  Patient presents with  . Other    12 month follow up. Patient denies chest pain and SOB. Meds reviewed verbally with patient.     HPI:  Kristina Haney is a very pleasant 77 year old woman with a strong family history of coronary artery disease (her father),  coronary calcium score of zero in 2009 and again in early 2014 ,  minimal carotid intimal thickening on carotid ultrasound (done outside the office),   Repeat carotid ultrasound through our office with no intimal thickening  hyperlipidemia , on statin CT scan showing mild, sometimes mild to moderate descending aortic atherosclerosis  Lipoprotein (a) 140s who presents for routine followup of her hyperlipidemia. diagnosis of rectal prolapse. S/p  surgery to repair her rectal prolapse 12/2009.  Reports recent diagnosis of osteoporosis Feeling well otherwise now on Fosamax 70 mg once a week No complaints, exercising well, No change in weight  Discussed lipid panel over the past several years Total cholesterol dropped from 220 now little over 150s Taking crestor 5 mg every other day,  Lp (a) previously 140s now 110  Scheduled to see endocrinology at [facility]  EKG personally reviewed by myself on today's visit shows sinus bradycardia with rate 49 bpm, no significant ST or T-wave changes  Other past medical history reviewed Previous abdominal CT scan to rule out neoplastic growth  ( family history of pancreatic cancer) at least mild atherosclerotic plaque in the distal aorta (select areas with mild to moderate disease), minimal plaquing in the right common iliac  The past medical history Previous stress echo was normal   PMH:   has a past medical history of Hyperlipidemia, Personal history of unspecified circulatory disease, and Routine general medical examination at a health care  facility.  PSH:    Past Surgical History:  Procedure Laterality Date  . APPENDECTOMY  1999    Current Outpatient Medications  Medication Sig Dispense Refill  . alendronate (FOSAMAX) 70 MG tablet Take by mouth.    . Ascorbic Acid (VITAMIN C) 1000 MG tablet Take 1,000 mg by mouth daily.    Marland Kitchen b complex vitamins tablet Take 1 tablet by mouth daily.    . Biotin 5000 MCG CAPS Take 5,000 mcg by mouth daily.    . Calcium 1500 MG tablet Take 1,500 mg by mouth daily.    . Cholecalciferol (VITAMIN D3) 2000 UNITS capsule Take 2,000 Units by mouth daily.      . Flax OIL Take by mouth daily.     . Niacinamide-Zinc-Copper-FA (NICOTINAMIDE ZCF PO) Take by mouth.    . rosuvastatin (CRESTOR) 10 MG tablet Take 1 tablet (10 mg total) by mouth daily. 90 tablet 3   No current facility-administered medications for this visit.      Allergies:   Iodinated diagnostic agents; Iohexol; and Hydrocortisone   Social History:  The patient  reports that she has never smoked. She has never used smokeless tobacco. She reports that she drinks about 0.6 oz of alcohol per week. She reports that she does not use drugs.   Family History:   family history includes Heart Problems in her father and mother; Heart attack in her father; Stroke in her mother.    Review of Systems: Review of Systems  Constitutional: Negative.   Respiratory: Negative.   Cardiovascular: Negative.   Gastrointestinal: Negative.  Musculoskeletal: Negative.   Neurological: Negative.   Psychiatric/Behavioral: Negative.   All other systems reviewed and are negative.    PHYSICAL EXAM: VS:  BP 100/70 (BP Location: Left Arm, Patient Position: Sitting, Cuff Size: Normal)   Pulse (!) 49   Ht 5\' 6"  (1.676 m)   Wt 135 lb (61.2 kg)   BMI 21.79 kg/m  , BMI Body mass index is 21.79 kg/m. Constitutional:  oriented to person, place, and time. No distress.  HENT:  Head: Normocephalic and atraumatic.  Eyes:  no discharge. No scleral icterus.   Neck: Normal range of motion. Neck supple. No JVD present.  Cardiovascular: Normal rate, regular rhythm, normal heart sounds and intact distal pulses. Exam reveals no gallop and no friction rub. No edema No murmur heard. Pulmonary/Chest: Effort normal and breath sounds normal. No stridor. No respiratory distress.  no wheezes.  no rales.  no tenderness.  Abdominal: Soft.  no distension.  no tenderness.  Musculoskeletal: Normal range of motion.  no  tenderness or deformity.  Neurological:  normal muscle tone. Coordination normal. No atrophy Skin: Skin is warm and dry. No rash noted. not diaphoretic.  Psychiatric:  normal mood and affect. behavior is normal. Thought content normal.    Recent Labs: No results found for requested labs within last 8760 hours.    Lipid Panel Lab Results  Component Value Date   CHOL 198 10/14/2008   HDL 71.0 10/14/2008   LDLCALC 114.2 10/14/2008   TRIG 64 10/14/2008      Wt Readings from Last 3 Encounters:  10/03/17 135 lb (61.2 kg)  07/01/16 134 lb 12 oz (61.1 kg)  09/10/15 134 lb (60.8 kg)       ASSESSMENT AND PLAN:   Abdominal aortic atherosclerosis (HCC) Previously seen on CT scan. Discuss with her again today We'll push for aggressive lipid management Recommended slight increase to her Crestor 10 alternating with 5, even up to 10 mg daily if tolerated to achieve LDL less than 70, preferably 60  Mixed hyperlipidemia We will slowly go up on the Crestor from 5-10 if tolerated Goal LDL 60  Elevated lipoprotein(a) 144 previously now down to 110  Encouraged continued aggressive lifestyle habits  Osteoporosis Started on Fosamax by Kristina Haney   Total encounter time more than 25 minutes  Greater than 50% was spent in counseling and coordination of care with the patient  Disposition:   F/U  12 months   Orders Placed This Encounter  Procedures  . EKG 12-Lead     Signed, Esmond Plants, M.D., Ph.D. 10/03/2017  [place], [place]

## 2017-10-03 ENCOUNTER — Ambulatory Visit (INDEPENDENT_AMBULATORY_CARE_PROVIDER_SITE_OTHER): Payer: Medicare Other | Admitting: Cardiovascular Disease

## 2017-10-03 ENCOUNTER — Encounter: Payer: Self-pay | Admitting: Cardiovascular Disease

## 2017-10-03 VITALS — BP 100/70 | HR 49 | Ht 66.0 in | Wt 135.0 lb

## 2017-10-03 DIAGNOSIS — I700 Atherosclerosis of aorta: Secondary | ICD-10-CM

## 2017-10-03 DIAGNOSIS — M810 Age-related osteoporosis without current pathological fracture: Secondary | ICD-10-CM | POA: Insufficient documentation

## 2017-10-03 DIAGNOSIS — E7841 Elevated Lipoprotein(a): Secondary | ICD-10-CM

## 2017-10-03 DIAGNOSIS — E782 Mixed hyperlipidemia: Secondary | ICD-10-CM | POA: Diagnosis not present

## 2017-10-03 MED ORDER — ROSUVASTATIN CALCIUM 10 MG PO TABS: 10.0000 mg | ORAL_TABLET | Freq: Every day | ORAL | 3 refills | Status: DC

## 2017-10-03 NOTE — Patient Instructions (Signed)
Medication Instructions:   Please start the crestor 10 mg daily Ok to stop aspirin  Labwork:  No new labs needed  Testing/Procedures:  No further testing at this time   Follow-Up: It was a pleasure seeing you in the office today. Please call us if you have new issues that need to be addressed before your next appt.  [phone]  Your physician wants you to follow-up in: 12 months.  You will receive a reminder letter in the mail two months in advance. If you don't receive a letter, please call our office to schedule the follow-up appointment.  If you need a refill on your cardiac medications before your next appointment, please call your pharmacy.  For educational health videos Log in to : [email or web address] Or : [email or web address], password : [email or web address]

## 2018-11-06 ENCOUNTER — Telehealth: Payer: Self-pay

## 2018-11-06 NOTE — Telephone Encounter (Signed)
Virtual Visit Pre-Appointment Phone Call  "Kristina Haney, I am calling you today to discuss your upcoming appointment. We are currently trying to limit exposure to the virus that causes COVID-19 by seeing patients at home rather than in the office."  1. "What is the BEST phone number to call the day of the visit?" - include this in appointment notes  2. "Do you have or have access to (through a family member/friend) a smartphone with video capability that we can use for your visit?" a. If yes - list this number in appt notes as "cell" (if different from BEST phone #) and list the appointment type as a VIDEO visit in appointment notes b. If no - list the appointment type as a PHONE visit in appointment notes  3. Confirm consent - "In the setting of the current Covid19 crisis, you are scheduled for a video visit with your provider on December 27, 2018 at 2:20PM.  Just as we do with many in-office visits, in order for you to participate in this visit, we must obtain consent.  If you'd like, I can send this to your mychart (if signed up) or email for you to review.  Otherwise, I can obtain your verbal consent now.  All virtual visits are billed to your insurance company just like a normal visit would be.  By agreeing to a virtual visit, we'd like you to understand that the technology does not allow for your provider to perform an examination, and thus may limit your provider's ability to fully assess your condition. If your provider identifies any concerns that need to be evaluated in person, we will make arrangements to do so.  Finally, though the technology is pretty good, we cannot assure that it will always work on either your or our end, and in the setting of a video visit, we may have to convert it to a phone-only visit.  In either situation, we cannot ensure that we have a secure connection.  Are you willing to proceed?" STAFF: Did the patient verbally acknowledge consent to telehealth visit? Document YES/NO  here: YES  4. Advise patient to be prepared - "Two hours prior to your appointment, go ahead and check your blood pressure, pulse, oxygen saturation, and your weight (if you have the equipment to check those) and write them all down. When your visit starts, your provider will ask you for this information. If you have an Apple Watch or Kardia device, please plan to have heart rate information ready on the day of your appointment. Please have a pen and paper handy nearby the day of the visit as well."  5. Give patient instructions for MyChart download to smartphone OR Doximity/Doxy.me as below if video visit (depending on what platform provider is using)  6. Inform patient they will receive a phone call 15 minutes prior to their appointment time (may be from unknown caller ID) so they should be prepared to answer    TELEPHONE CALL NOTE  Kristina Haney has been deemed a candidate for a follow-up tele-health visit to limit community exposure during the Covid-19 pandemic. I spoke with the patient via phone to ensure availability of phone/video source, confirm preferred email & phone number, and discuss instructions and expectations.  I reminded Kristina Haney to be prepared with any vital sign and/or heart rhythm information that could potentially be obtained via home monitoring, at the time of her visit. I reminded Kristina Haney to expect a phone call prior to  her visit.  Rene Paci McClain 11/06/2018 12:55 PM   INSTRUCTIONS FOR DOWNLOADING THE MYCHART APP TO SMARTPHONE  - The patient must first make sure to have activated MyChart and know their login information - If Apple, go to [employer] and type in MyChart in the search bar and download the app. If Android, ask patient to go to [employer] and type in [place] in the search bar and download the app. The app is free but as with any other app downloads, their phone may require them to verify saved payment information or Apple/Android  password.  - The patient will need to then log into the app with their MyChart username and password, and select [facility] as their healthcare provider to link the account. When it is time for your visit, go to the MyChart app, find appointments, and click Begin Video Visit. Be sure to Select Allow for your device to access the Microphone and Camera for your visit. You will then be connected, and your provider will be with you shortly.  **If they have any issues connecting, or need assistance please contact MyChart service desk [phone])**  **If using a computer, in order to ensure the best quality for their visit they will need to use either of the following Internet Browsers: [employer], or Google Chrome**  IF USING DOXIMITY or DOXY.ME - The patient will receive a link just prior to their visit by text.     FULL LENGTH CONSENT FOR TELE-HEALTH VISIT   I hereby voluntarily request, consent and authorize [facility] and its employed or contracted physicians, physician assistants, nurse practitioners or other licensed health care professionals (the Practitioner), to provide me with telemedicine health care services (the "Services") as deemed necessary by the treating Practitioner. I acknowledge and consent to receive the Services by the Practitioner via telemedicine. I understand that the telemedicine visit will involve communicating with the Practitioner through live audiovisual communication technology and the disclosure of certain medical information by electronic transmission. I acknowledge that I have been given the opportunity to request an in-person assessment or other available alternative prior to the telemedicine visit and am voluntarily participating in the telemedicine visit.  I understand that I have the right to withhold or withdraw my consent to the use of telemedicine in the course of my care at any time, without affecting my right to future care or treatment,  and that the Practitioner or I may terminate the telemedicine visit at any time. I understand that I have the right to inspect all information obtained and/or recorded in the course of the telemedicine visit and may receive copies of available information for a reasonable fee.  I understand that some of the potential risks of receiving the Services via telemedicine include:  Marland Kitchen Delay or interruption in medical evaluation due to technological equipment failure or disruption; . Information transmitted may not be sufficient (e.g. poor resolution of images) to allow for appropriate medical decision making by the Practitioner; and/or  . In rare instances, security protocols could fail, causing a breach of personal health information.  Furthermore, I acknowledge that it is my responsibility to provide information about my medical history, conditions and care that is complete and accurate to the best of my ability. I acknowledge that Practitioner's advice, recommendations, and/or decision may be based on factors not within their control, such as incomplete or inaccurate data provided by me or distortions of diagnostic images or specimens that may result from electronic transmissions.  I understand that the practice of medicine is not an exact science and that Practitioner makes no warranties or guarantees regarding treatment outcomes. I acknowledge that I will receive a copy of this consent concurrently upon execution via email to the email address I last provided but may also request a printed copy by calling the office of [facility].    I understand that my insurance will be billed for this visit.   I have read or had this consent read to me. . I understand the contents of this consent, which adequately explains the benefits and risks of the Services being provided via telemedicine.  . I have been provided ample opportunity to ask questions regarding this consent and the Services and have had my questions  answered to my satisfaction. . I give my informed consent for the services to be provided through the use of telemedicine in my medical care  By participating in this telemedicine visit I agree to the above.

## 2018-12-27 ENCOUNTER — Telehealth: Payer: Medicare Other | Admitting: Cardiovascular Disease

## 2019-01-17 ENCOUNTER — Other Ambulatory Visit: Payer: Self-pay | Admitting: Internal Medicine

## 2019-01-17 ENCOUNTER — Other Ambulatory Visit (HOSPITAL_COMMUNITY): Payer: Self-pay | Admitting: Internal Medicine

## 2019-01-17 DIAGNOSIS — R1030 Lower abdominal pain, unspecified: Secondary | ICD-10-CM

## 2019-01-25 ENCOUNTER — Other Ambulatory Visit: Payer: Self-pay

## 2019-01-25 ENCOUNTER — Ambulatory Visit
Admission: RE | Admit: 2019-01-25 | Discharge: 2019-01-25 | Disposition: A | Discharge status: Home / Self Care | Payer: Medicare Other | Source: Ambulatory Visit | Attending: Internal Medicine | Admitting: Internal Medicine

## 2019-01-25 ENCOUNTER — Ambulatory Visit: Payer: Medicare Other

## 2019-01-25 DIAGNOSIS — R1030 Lower abdominal pain, unspecified: Secondary | ICD-10-CM | POA: Insufficient documentation

## 2019-01-25 MED ORDER — IOHEXOL 300 MG/ML  SOLN
100.0000 mL | Freq: Once | INTRAMUSCULAR | Status: AC | PRN
  Administered 2019-01-25: 100 mL via INTRAVENOUS

## 2019-02-27 ENCOUNTER — Ambulatory Visit: Payer: Medicare Other | Admitting: Cardiovascular Disease

## 2019-03-23 NOTE — Progress Notes (Signed)
Cardiology Office Note  Date:  03/26/2019   ID:  Kristina Haney, DOB 04-15-1941, MRN [id number]  PCP:  Kristina Crouch, MD   Chief Complaint  Patient presents with  . other    12 month follow up. Meds reviewed by the pt. verbally. "doing well."     HPI:  [name] is a very pleasant 78 year old woman with a strong family history of coronary artery disease (her father),  coronary calcium score of zero in 2009 and again in early 2014 ,  minimal carotid intimal thickening on carotid ultrasound (done outside the office),   Repeat carotid ultrasound through our office with no intimal thickening  hyperlipidemia , on statin CT scan showing mild, sometimes mild to moderate descending aortic atherosclerosis  Lipoprotein (a) 140s who presents for routine followup of her hyperlipidemia.   diagnosis of rectal prolapse. S/p  surgery to repair her rectal prolapse 12/2009.  Feels well, no new complaints Discussed lipoprotein a, lipid levels with her  Discussed recent echocardiogram showing mild mitral valve regurgitation no prolapse Unable to see images to review with her  Continues to exercise on a regular basis  EKG personally reviewed by myself on today's visit shows sinus bradycardia with rate 55 bpm, no significant ST or T-wave changes  Other past medical history reviewed Previous abdominal CT scan to rule out neoplastic growth  ( family history of pancreatic cancer) at least mild atherosclerotic plaque in the distal aorta (select areas with mild to moderate disease), minimal plaquing in the right common iliac    PMH:   has a past medical history of Hyperlipidemia, Personal history of unspecified circulatory disease, and Routine general medical examination at a health care facility.  PSH:    Past Surgical History:  Procedure Laterality Date  . APPENDECTOMY  1999    Current Outpatient Medications  Medication Sig Dispense Refill  . alendronate (FOSAMAX) 70 MG tablet Take by  mouth.    . Ascorbic Acid (VITAMIN C) 1000 MG tablet Take 1,000 mg by mouth daily.    Marland Kitchen b complex vitamins tablet Take 1 tablet by mouth daily.    . Biotin 5000 MCG CAPS Take 5,000 mcg by mouth daily.    . Calcium 1500 MG tablet Take 1,500 mg by mouth daily.    . Cholecalciferol (VITAMIN D3) 2000 UNITS capsule Take 2,000 Units by mouth daily.      . Flax OIL Take by mouth daily.     . Niacinamide-Zinc-Copper-FA (NICOTINAMIDE ZCF PO) Take by mouth.    . Tretinoin, Facial Wrinkles, (RENOVA PUMP) 0.02 % CREA Apply to face at bedtime    . rosuvastatin (CRESTOR) 10 MG tablet Take 1 tablet (10 mg total) by mouth daily. 90 tablet 3   No current facility-administered medications for this visit.     Allergies:   Iodinated diagnostic agents, Bacitracin-neomycin-polymyxin, Bee venom, Iohexol, Zoster vaccine live, and Hydrocortisone   Social History:  The patient  reports that she has never smoked. She has never used smokeless tobacco. She reports current alcohol use of about 1.0 standard drinks of alcohol per week. She reports that she does not use drugs.   Family History:   family history includes Heart Problems in her father and mother; Heart attack in her father; Stroke in her mother.    Review of Systems: Review of Systems  Constitutional: Negative.   Respiratory: Negative.   Cardiovascular: Negative.   Gastrointestinal: Negative.   Musculoskeletal: Negative.   Neurological: Negative.   Psychiatric/Behavioral: Negative.  All other systems reviewed and are negative.    PHYSICAL EXAM: VS:  BP 132/80 (BP Location: Left Arm, Patient Position: Sitting, Cuff Size: Normal)   Pulse (!) 55   Ht 5' 5.5" (1.664 m)   Wt 134 lb (60.8 kg)   BMI 21.96 kg/m  , BMI Body mass index is 21.96 kg/m. Constitutional:  oriented to person, place, and time. No distress.  HENT:  Head: Grossly normal Eyes:  no discharge. No scleral icterus.  Neck: No JVD, no carotid bruits  Cardiovascular: Regular rate  and rhythm, no murmurs appreciated Pulmonary/Chest: Clear to auscultation bilaterally, no wheezes or rails Abdominal: Soft.  no distension.  no tenderness.  Musculoskeletal: Normal range of motion Neurological:  normal muscle tone. Coordination normal. No atrophy Skin: Skin warm and dry Psychiatric: normal affect, pleasant   Recent Labs: No results found for requested labs within last 8760 hours.    Lipid Panel Lab Results  Component Value Date   CHOL 198 10/14/2008   HDL 71.0 10/14/2008   LDLCALC 114.2 10/14/2008   TRIG 64 10/14/2008      Wt Readings from Last 3 Encounters:  03/26/19 134 lb (60.8 kg)  10/03/17 135 lb (61.2 kg)  07/01/16 134 lb 12 oz (61.1 kg)     ASSESSMENT AND PLAN:  Abdominal aortic atherosclerosis (HCC) Previously seen on CT scan.  Cholesterol close to goal, no medication changes made  Mixed hyperlipidemia Continue Crestor 10 mg daily  Elevated lipoprotein(a) Encouraged continued aggressive lifestyle habits Continue Crestor Long discussion concerning her risk factors which are very well controlled  Osteoporosis Started on Fosamax by Dr. Doy Hutching  Mitral valve regurgitation No murmur on exam, suspect mild   Total encounter time more than 25 minutes  Greater than 50% was spent in counseling and coordination of care with the patient  Disposition:   F/U  12 months   No orders of the defined types were placed in this encounter.    Signed, Kristina Haney, M.D., Ph.D. 03/26/2019  [place], [place]

## 2019-03-24 ENCOUNTER — Other Ambulatory Visit: Payer: Self-pay | Admitting: Cardiovascular Disease

## 2019-03-26 ENCOUNTER — Encounter: Payer: Self-pay | Admitting: Cardiovascular Disease

## 2019-03-26 ENCOUNTER — Other Ambulatory Visit: Payer: Self-pay

## 2019-03-26 ENCOUNTER — Ambulatory Visit (INDEPENDENT_AMBULATORY_CARE_PROVIDER_SITE_OTHER): Payer: Medicare Other | Admitting: Cardiovascular Disease

## 2019-03-26 VITALS — BP 132/80 | HR 55 | Ht 65.5 in | Wt 134.0 lb

## 2019-03-26 DIAGNOSIS — I700 Atherosclerosis of aorta: Secondary | ICD-10-CM

## 2019-03-26 DIAGNOSIS — M810 Age-related osteoporosis without current pathological fracture: Secondary | ICD-10-CM

## 2019-03-26 DIAGNOSIS — E782 Mixed hyperlipidemia: Secondary | ICD-10-CM

## 2019-03-26 DIAGNOSIS — E7841 Elevated Lipoprotein(a): Secondary | ICD-10-CM

## 2019-03-26 NOTE — Patient Instructions (Signed)

## 2019-03-27 ENCOUNTER — Telehealth: Payer: Self-pay | Admitting: Cardiovascular Disease

## 2019-03-27 NOTE — Telephone Encounter (Signed)
CD of the patient's August 2020 echo. Placed on Dr. Donivan Scull desk to be reviewed.

## 2019-03-27 NOTE — Telephone Encounter (Signed)
Patient came by office and dropped off CD to be reviewed Placed in nurse box

## 2019-04-11 ENCOUNTER — Telehealth: Payer: Self-pay | Admitting: Cardiovascular Disease

## 2019-04-11 NOTE — Telephone Encounter (Signed)
I did review the echocardiogram she had performed recently at [facility].  Off the CD she dropped off  It did show some mitral valve regurgitation at least mild to moderate, and select images looked moderate regurgitation Surprising as we did not hear much of a murmur on exam Unable to exclude mitral valve prolapse which could be contributing to the regurgitation  No additional studies needed at this time, would probably recommend echocardiogram each year for close monitoring  thx TGollan

## 2019-04-13 ENCOUNTER — Telehealth: Payer: Self-pay | Admitting: Cardiovascular Disease

## 2019-04-13 NOTE — Telephone Encounter (Signed)
Okay to proceed with endoscopy Acceptable risk, no further testing needed

## 2019-04-13 NOTE — Telephone Encounter (Signed)
Patient calling to ask about safety of endoscopy given recent echo results.

## 2019-04-13 NOTE — Telephone Encounter (Signed)
Routed to Dr.Gollan to advise. 

## 2019-04-13 NOTE — Telephone Encounter (Signed)
I called and spoke with the patient. I advised her that Dr. Rockey Situ had reviewed her echo results on CD from [facility]. I have also advised her of his findings and recommendations.  The patient is due to see Dr. Rockey Situ back in October 2021.  She is aware we can set her up for a repeat echo at that time. She voiced understanding and was agreeable.

## 2019-04-16 NOTE — Telephone Encounter (Signed)
Call to patient to update on recommendation from Dr. Zella Richer.   She verbalized understanding and took down fax number in case [facility] will need cardiac clearance on paper.   Pt appreciative of call and will call back if she has any further questions or concerns.

## 2019-04-26 ENCOUNTER — Telehealth: Payer: Self-pay | Admitting: Cardiovascular Disease

## 2019-04-26 NOTE — Telephone Encounter (Signed)
   [facility] Pre-operative Risk Assessment    Request for surgical clearance:  1. What type of surgery is being performed? EGD  2. When is this surgery scheduled? 05/03/19  3. What type of clearance is required (medical clearance vs. Pharmacy clearance to hold med vs. Both)? NOT LISTED  4. Are there any medications that need to be held prior to surgery and how long? NOT LISTED  5. Practice name and name of physician performing surgery? [facility]  6. What is your office phone number [phone]   7.   What is your office fax number [phone]  8.   Anesthesia type (None, local, MAC, general) ? MODERATE  Kristina Haney 04/26/2019, 3:29 PM  _________________________________________________________________   (provider comments below)

## 2019-04-26 NOTE — Telephone Encounter (Signed)
   Primary Cardiologist: Ida Rogue, MD  Chart reviewed as part of pre-operative protocol coverage. Given past medical history and time since last visit, based on ACC/AHA guidelines, JENALIS DUTCH would be at acceptable risk for the planned procedure without further cardiovascular testing.   Per Dr. Rockey Situ note from 04/13/2019 patient will be at acceptable risk to proceed with procedure with no further testing.   I will route this recommendation to the requesting party via Epic fax function and remove from pre-op pool.  Please call with questions.  Kathyrn Drown, NP 04/26/2019, 12:05 PM

## 2019-04-26 NOTE — Telephone Encounter (Signed)
° °  [facility] Pre-operative Risk Assessment    Request for surgical clearance:  1. What type of surgery is being performed? EGD   2. When is this surgery scheduled? 05/03/19   3. What type of clearance is required (medical clearance vs. Pharmacy clearance to hold med vs. Both)? both  4. Are there any medications that need to be held prior to surgery and how long? None listed, please advise if there are any medications to hold    5. Practice name and name of physician performing surgery? [facility], done at [facility] with Dr Cristi Loron   6. What is your office phone number [phone]    7.   What is your office fax number [phone]  8.   Anesthesia type (None, local, MAC, general) ? Moderate    Kristina Haney 04/26/2019, 11:14 AM  _________________________________________________________________   (provider comments below)

## 2019-07-17 ENCOUNTER — Other Ambulatory Visit: Payer: Self-pay | Admitting: Internal Medicine

## 2019-07-17 DIAGNOSIS — G8929 Other chronic pain: Secondary | ICD-10-CM

## 2019-07-17 DIAGNOSIS — M5441 Lumbago with sciatica, right side: Secondary | ICD-10-CM

## 2019-07-22 ENCOUNTER — Ambulatory Visit: Payer: Medicare Other

## 2019-08-07 ENCOUNTER — Other Ambulatory Visit: Payer: Self-pay

## 2019-08-07 ENCOUNTER — Other Ambulatory Visit: Payer: Self-pay | Admitting: Physician Assistant

## 2019-08-07 ENCOUNTER — Ambulatory Visit
Admission: RE | Admit: 2019-08-07 | Discharge: 2019-08-07 | Disposition: A | Discharge status: Home / Self Care | Payer: Medicare Other | Source: Ambulatory Visit | Attending: Physician Assistant | Admitting: Physician Assistant

## 2019-08-07 DIAGNOSIS — R1012 Left upper quadrant pain: Secondary | ICD-10-CM | POA: Insufficient documentation

## 2019-08-08 ENCOUNTER — Ambulatory Visit: Payer: Medicare Other

## 2019-12-17 ENCOUNTER — Other Ambulatory Visit: Payer: Self-pay

## 2019-12-17 ENCOUNTER — Encounter: Payer: Self-pay | Admitting: Podiatry

## 2019-12-17 ENCOUNTER — Ambulatory Visit (INDEPENDENT_AMBULATORY_CARE_PROVIDER_SITE_OTHER): Payer: Medicare Other | Admitting: Podiatry

## 2019-12-17 VITALS — BP 135/57 | HR 47

## 2019-12-17 DIAGNOSIS — L84 Corns and callosities: Secondary | ICD-10-CM

## 2019-12-17 DIAGNOSIS — L98491 Non-pressure chronic ulcer of skin of other sites limited to breakdown of skin: Secondary | ICD-10-CM | POA: Diagnosis not present

## 2019-12-17 MED ORDER — MUPIROCIN CALCIUM 2 % EX CREA: 1.0000 "application " | TOPICAL_CREAM | Freq: Two times a day (BID) | CUTANEOUS | 0 refills | Status: DC

## 2019-12-17 MED ORDER — DOXYCYCLINE HYCLATE 100 MG PO TABS: 100.0000 mg | ORAL_TABLET | Freq: Two times a day (BID) | ORAL | 0 refills | Status: DC

## 2019-12-18 ENCOUNTER — Encounter: Payer: Self-pay | Admitting: Podiatry

## 2019-12-18 NOTE — Progress Notes (Addendum)
Subjective:  Patient ID: Kristina Haney, female    DOB: 1940-11-03,  MRN: [id number]  Chief Complaint  Patient presents with  . Callouses    B/L great toes, left 4th toes painful    79 y.o. female presents with the above complaint.  Patient presents with left fourth and fifth digit heloma molle bilaterally with the left being severe than right.  Patient states the right side does not hurt currently and occasionally hurts however the left side has been a lot more painful.  She has tried putting ointment and keeping it covered but has not helped.  There is some redness associated with it.  There is an open lesion/wound.  She denies any other treatment options.  She states that she is use corn pad remover as well as salicylic acid to counter burn the corn.  She is use corn pads which has probably will made it infected.  She has made some shoe gear modifications which has helped some.  She denies any other acute complaints.  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  She also had orthotics made in the past by [place] which are about 28 to 77 years old and have been worn out.  I will have her follow-up with Liliane Channel for modification.   Review of Systems: Negative except as noted in the HPI. Denies N/V/F/Ch.  Past Medical History:  Diagnosis Date  . Hyperlipidemia   . Personal history of unspecified circulatory disease   . Routine general medical examination at a health care facility     Current Outpatient Medications:  .  Ascorbic Acid (VITAMIN C) 1000 MG tablet, Take 1,000 mg by mouth daily., Disp: , Rfl:  .  b complex vitamins tablet, Take 1 tablet by mouth daily., Disp: , Rfl:  .  Biotin 5000 MCG CAPS, Take 5,000 mcg by mouth daily., Disp: , Rfl:  .  Calcium 1500 MG tablet, Take 1,500 mg by mouth daily., Disp: , Rfl:  .  Cholecalciferol (VITAMIN D3) 2000 UNITS capsule, Take 2,000 Units by mouth daily.  , Disp: , Rfl:  .  Flax OIL, Take by mouth daily. ,  Disp: , Rfl:  .  rosuvastatin (CRESTOR) 10 MG tablet, Take 1 tablet (10 mg total) by mouth daily., Disp: 90 tablet, Rfl: 3 .  Tretinoin, Facial Wrinkles, (RENOVA PUMP) 0.02 % CREA, Apply to face at bedtime, Disp: , Rfl:  .  doxycycline (VIBRA-TABS) 100 MG tablet, Take 1 tablet (100 mg total) by mouth 2 (two) times daily., Disp: 20 tablet, Rfl: 0 .  mupirocin cream (BACTROBAN) 2 %, Apply 1 application topically 2 (two) times daily., Disp: 15 g, Rfl: 0  Social History   Tobacco Use  Smoking Status Never Smoker  Smokeless Tobacco Never Used  Tobacco Comment   tobacco use- no     Allergies  Allergen Reactions  . Iodinated Diagnostic Agents Rash and Hives    Can tolerate with a steroid prep Can tolerate with a steroid prep Can tolerate with a steroid prep   . Bacitracin-Neomycin-Polymyxin Other (See Comments)  . Bee Venom Swelling  . Iohexol Hives    ivp dye  . Zoster Vaccine Live Other (See Comments)  . Hydrocortisone Rash   Objective:   Vitals:   12/17/19 1618  BP: (!) 135/57  Pulse: (!) 47   There is no height or weight on file to calculate BMI. Constitutional Well developed. Well nourished.  Vascular Dorsalis  pedis pulses palpable bilaterally. Posterior tibial pulses palpable bilaterally. Capillary refill normal to all digits.  No cyanosis or clubbing noted. Pedal hair growth normal.  Neurologic Normal speech. Oriented to person, place, and time. Epicritic sensation to light touch grossly present bilaterally.  Dermatologic  hyperkeratotic lesion noted to bilateral facing side of fourth and fifth digit.  There is a small ulceration noted to the left heloma molle partial-thickness in nature.  There is some redness associated with it.  No purulent drainage was expressed.  No exposed bone was noted.  Tender to touch.  No other clinical signs of infection noted.  Orthopedic: Normal joint ROM without pain or crepitus bilaterally. No visible deformities. No bony tenderness.     Radiographs: None Assessment:   1. Heloma molle   2. Skin ulcer, limited to breakdown of skin ([place])    Plan:  Patient was evaluated and treated and all questions answered.  Left fourth/fifth digit heloma molle/ulcer with underlying erythema -I explained to patient the etiology of ulcer and various treatment options were extensively discussed.  I believe that the therapy that she was using to apply for corn that may have caused a small ulceration leading to tenderness and soreness.  I have asked the patient to keep it covered with some bacitracin and Band-Aid.  Patient states that she would do that.  I also discussed with your shoe gear modification with new balance wide/extrawide shoes.  Patient states that she'll work on obtaining that. -She will also follow-up with Liliane Channel for adjustments/may be referred location of the orthotic that she has been about 5 to 10 years ago.  If there is no way to refurbish it then I'll let Liliane Channel make new pair of orthotics.  This will help address the pes planovalgus deformity and give her support in the arches as well as the heel. -Doxycycline was dispensed for skin and soft tissue prophylaxis.  I have asked the patient if the redness worsens or there is more edema I have asked her to go to the emergency room or contact the office right away.  Patient states understanding  No follow-ups on file.

## 2020-01-01 ENCOUNTER — Ambulatory Visit (INDEPENDENT_AMBULATORY_CARE_PROVIDER_SITE_OTHER): Payer: Medicare Other | Admitting: Podiatry

## 2020-01-01 ENCOUNTER — Other Ambulatory Visit: Payer: Self-pay

## 2020-01-01 ENCOUNTER — Encounter: Payer: Self-pay | Admitting: Podiatry

## 2020-01-01 DIAGNOSIS — L84 Corns and callosities: Secondary | ICD-10-CM

## 2020-01-01 DIAGNOSIS — L98491 Non-pressure chronic ulcer of skin of other sites limited to breakdown of skin: Secondary | ICD-10-CM

## 2020-01-02 ENCOUNTER — Encounter: Payer: Self-pay | Admitting: Podiatry

## 2020-01-02 NOTE — Progress Notes (Signed)
Subjective:  Patient ID: Kristina Haney, female    DOB: 30-Nov-1940,  MRN: [id number]  Chief Complaint  Patient presents with  . Callouses    "its doing much better"    79 y.o. female presents with the above complaint.  Patient presents with left fourth and fifth digit heloma molle bilaterally with the left being severe than right.  Patient states the right side does not hurt currently and occasionally hurts however the left side has been a lot more painful.  She has tried putting ointment and keeping it covered but has not helped.  There is some redness associated with it.  There is an open lesion/wound.  She denies any other treatment options.  She states that she is use corn pad remover as well as salicylic acid to counter burn the corn.  She is use corn pads which has probably will made it infected.  She has made some shoe gear modifications which has helped some.  She denies any other acute complaints.  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  She also had orthotics made in the past by [place] which are about 21 to 77 years old and have been worn out.  I will have her follow-up with Liliane Channel for modification.   Review of Systems: Negative except as noted in the HPI. Denies N/V/F/Ch.  Past Medical History:  Diagnosis Date  . Hyperlipidemia   . Personal history of unspecified circulatory disease   . Routine general medical examination at a health care facility     Current Outpatient Medications:  .  Ascorbic Acid (VITAMIN C) 1000 MG tablet, Take 1,000 mg by mouth daily., Disp: , Rfl:  .  b complex vitamins tablet, Take 1 tablet by mouth daily., Disp: , Rfl:  .  Biotin 5000 MCG CAPS, Take 5,000 mcg by mouth daily., Disp: , Rfl:  .  Calcium 1500 MG tablet, Take 1,500 mg by mouth daily., Disp: , Rfl:  .  Cholecalciferol (VITAMIN D3) 2000 UNITS capsule, Take 2,000 Units by mouth daily.  , Disp: , Rfl:  .  doxycycline (VIBRA-TABS) 100 MG tablet, Take 1  tablet (100 mg total) by mouth 2 (two) times daily., Disp: 20 tablet, Rfl: 0 .  Flax OIL, Take by mouth daily. , Disp: , Rfl:  .  mupirocin cream (BACTROBAN) 2 %, Apply 1 application topically 2 (two) times daily., Disp: 15 g, Rfl: 0 .  rosuvastatin (CRESTOR) 10 MG tablet, Take 1 tablet (10 mg total) by mouth daily., Disp: 90 tablet, Rfl: 3 .  Tretinoin, Facial Wrinkles, (RENOVA PUMP) 0.02 % CREA, Apply to face at bedtime, Disp: , Rfl:   Social History   Tobacco Use  Smoking Status Never Smoker  Smokeless Tobacco Never Used  Tobacco Comment   tobacco use- no     Allergies  Allergen Reactions  . Iodinated Diagnostic Agents Rash and Hives    Can tolerate with a steroid prep Can tolerate with a steroid prep Can tolerate with a steroid prep   . Bacitracin-Neomycin-Polymyxin Other (See Comments)  . Bee Venom Swelling  . Iohexol Hives    ivp dye  . Zoster Vaccine Live Other (See Comments)  . Hydrocortisone Rash   Objective:   There were no vitals filed for this visit. There is no height or weight on file to calculate BMI. Constitutional Well developed. Well nourished.  Vascular Dorsalis pedis pulses palpable bilaterally. Posterior tibial pulses palpable bilaterally. Capillary  refill normal to all digits.  No cyanosis or clubbing noted. Pedal hair growth normal.  Neurologic Normal speech. Oriented to person, place, and time. Epicritic sensation to light touch grossly present bilaterally.  Dermatologic  resolving hyperkeratotic lesion noted to bilateral facing side of fourth and fifth digit.  There is a healed ulceration noted to the left heloma molle partial-thickness in nature.  There is no redness associated with it.  No purulent drainage was expressed.  No exposed bone was noted.  Tender to touch.  No other clinical signs of infection noted.  Orthopedic: Normal joint ROM without pain or crepitus bilaterally. No visible deformities. No bony tenderness.   Radiographs:  None Assessment:   1. Heloma molle   2. Skin ulcer, limited to breakdown of skin ([employer])    Plan:  Patient was evaluated and treated and all questions answered.  Left fourth/fifth digit heloma molle/ulcer with resolved erythema -Completely resolved.  Her pain from the [place] has decreased.  The ulceration has diminished and reepithelialized.  Patient had made shoe gear modification changes which helped tremendously.  Patient has continued to wear toe spacer which has helped a lot. -I instructed her to continue wearing toe spacers to take the pressure off of it. -If she continues to have pain we may need to surgically correct the digits.  I discussed this with the patient.  Patient states understanding. No follow-ups on file.

## 2020-01-23 ENCOUNTER — Other Ambulatory Visit: Payer: Medicare Other | Admitting: Orthotics

## 2020-02-06 ENCOUNTER — Other Ambulatory Visit: Payer: Self-pay

## 2020-02-06 ENCOUNTER — Ambulatory Visit (INDEPENDENT_AMBULATORY_CARE_PROVIDER_SITE_OTHER): Payer: Medicare Other | Admitting: Orthotics

## 2020-02-06 DIAGNOSIS — L98491 Non-pressure chronic ulcer of skin of other sites limited to breakdown of skin: Secondary | ICD-10-CM

## 2020-02-06 DIAGNOSIS — L84 Corns and callosities: Secondary | ICD-10-CM

## 2020-02-06 NOTE — Progress Notes (Signed)
Refurbishing f/o

## 2020-03-04 ENCOUNTER — Other Ambulatory Visit: Payer: Medicare Other

## 2020-03-05 ENCOUNTER — Telehealth: Payer: Self-pay | Admitting: Cardiovascular Disease

## 2020-03-05 ENCOUNTER — Other Ambulatory Visit: Payer: Self-pay

## 2020-03-05 DIAGNOSIS — I059 Rheumatic mitral valve disease, unspecified: Secondary | ICD-10-CM

## 2020-03-05 NOTE — Telephone Encounter (Signed)
Patient is scheduled for an ECHO 03/11/20 - no order placed  Per phone note 04/11/19 Dr Rockey Situ requested yearly ECHO before appointment, patient sees Dr Rockey Situ 10/04 Please review and place order for ECHO same day

## 2020-03-05 NOTE — Telephone Encounter (Signed)
Kristina Haney has previously sent a message to the triage pool. This has been addressed.

## 2020-03-10 ENCOUNTER — Other Ambulatory Visit: Payer: Self-pay | Admitting: Cardiovascular Disease

## 2020-03-11 ENCOUNTER — Ambulatory Visit (INDEPENDENT_AMBULATORY_CARE_PROVIDER_SITE_OTHER): Payer: Medicare Other

## 2020-03-11 ENCOUNTER — Other Ambulatory Visit: Payer: Self-pay

## 2020-03-11 DIAGNOSIS — I059 Rheumatic mitral valve disease, unspecified: Secondary | ICD-10-CM | POA: Diagnosis not present

## 2020-03-11 LAB — ECHOCARDIOGRAM COMPLETE
Area-P 1/2: 3.65 cm2
Calc EF: 48.8 %
S' Lateral: 3.3 cm
Single Plane A2C EF: 48.8 %
Single Plane A4C EF: 48.5 %

## 2020-03-21 NOTE — Progress Notes (Signed)
Cardiology Office Note  Date:  03/24/2020   ID:  Kristina Haney, DOB 08-Mar-1941, MRN [id number]  PCP:  Kristina Crouch, MD   Chief Complaint  Patient presents with  . OTHER    12 month f/u echo. Meds reviewed verbally with pt.    HPI:  Kristina Haney is a very pleasant 79 year old woman with a strong family history of coronary artery disease (her father),  coronary calcium score of zero in 2009 and again in early 2014 ,  minimal carotid intimal thickening on carotid ultrasound (done outside the office),   Repeat carotid ultrasound through our office with no intimal thickening  hyperlipidemia , on statin CT scan showing mild, sometimes mild to moderate descending aortic atherosclerosis  Lipoprotein (a) 140s who presents for routine followup of her hyperlipidemia.  Doing well, continues her regular exercise program CT abdomen pulled up from 2020 showing mild  distal aortic atherosclerosis  Prior CT scans with no significant coronary calcification, intimal thickening on prior carotid ultrasound She is interested in repeating the studies  Prior diagnosis of rectal prolapse. S/p  surgery to repair her rectal prolapse 12/2009.  Continues to have elevated LP(a) Total cholesterol 152 LDL 65  Echocardiogram images pulled up, mild MR, minimal prolapse  EKG personally reviewed by myself on today's visit shows sinus bradycardia with rate 47 bpm, no significant ST or T-wave changes  Other past medical history reviewed Previous abdominal CT scan to rule out neoplastic growth  ( family history of pancreatic cancer) at least mild atherosclerotic plaque in the distal aorta (select areas with mild to moderate disease), minimal plaquing in the right common iliac    PMH:   has a past medical history of Hyperlipidemia, Personal history of unspecified circulatory disease, and Routine general medical examination at a health care facility.  PSH:    Past Surgical History:  Procedure Laterality  Date  . APPENDECTOMY  1999    Current Outpatient Medications  Medication Sig Dispense Refill  . Ascorbic Acid (VITAMIN C) 1000 MG tablet Take 1,000 mg by mouth daily.    Marland Kitchen b complex vitamins tablet Take 1 tablet by mouth daily.    . Biotin 5000 MCG CAPS Take 5,000 mcg by mouth daily.    . Calcium 1500 MG tablet Take 1,500 mg by mouth daily.    . Cholecalciferol (VITAMIN D3) 2000 UNITS capsule Take 2,000 Units by mouth daily.      . Coenzyme Q10 (CO Q-10 PO) Take by mouth daily.    . cyanocobalamin 1000 MCG tablet Take by mouth daily.    . Flax OIL Take by mouth daily.     . rosuvastatin (CRESTOR) 10 MG tablet Take 1 tablet (10 mg total) by mouth daily. 90 tablet 4  . Tretinoin, Facial Wrinkles, (RENOVA PUMP) 0.02 % CREA Apply to face at bedtime     No current facility-administered medications for this visit.    Allergies:   Iodinated diagnostic agents, Bacitracin-neomycin-polymyxin, Bee venom, Iohexol, Zoster vaccine live, and Hydrocortisone   Social History:  The patient  reports that she has never smoked. She has never used smokeless tobacco. She reports current alcohol use of about 1.0 standard drink of alcohol per week. She reports that she does not use drugs.   Family History:   family history includes Heart Problems in her father and mother; Heart attack in her father; Stroke in her mother.    Review of Systems: Review of Systems  Constitutional: Negative.   Respiratory: Negative.  Cardiovascular: Negative.   Gastrointestinal: Negative.   Musculoskeletal: Negative.   Neurological: Negative.   Psychiatric/Behavioral: Negative.   All other systems reviewed and are negative.    PHYSICAL EXAM: VS:  BP 138/82 (BP Location: Left Arm, Patient Position: Sitting, Cuff Size: Normal)   Pulse (!) 47   Ht 5' 5.5" (1.664 m)   Wt 133 lb 2 oz (60.4 kg)   SpO2 98%   BMI 21.82 kg/m  , BMI Body mass index is 21.82 kg/m. Constitutional:  oriented to person, place, and time. No  distress.  HENT:  Head: Grossly normal Eyes:  no discharge. No scleral icterus.  Neck: No JVD, no carotid bruits  Cardiovascular: Regular rate and rhythm, no murmurs appreciated Pulmonary/Chest: Clear to auscultation bilaterally, no wheezes or rails Abdominal: Soft.  no distension.  no tenderness.  Musculoskeletal: Normal range of motion Neurological:  normal muscle tone. Coordination normal. No atrophy Skin: Skin warm and dry Psychiatric: normal affect, pleasant  Recent Labs: No results found for requested labs within last 8760 hours.    Lipid Panel Lab Results  Component Value Date   CHOL 198 10/14/2008   HDL 71.0 10/14/2008   LDLCALC 114.2 10/14/2008   TRIG 64 10/14/2008      Wt Readings from Last 3 Encounters:  03/24/20 133 lb 2 oz (60.4 kg)  03/26/19 134 lb (60.8 kg)  10/03/17 135 lb (61.2 kg)     ASSESSMENT AND PLAN:  Abdominal aortic atherosclerosis (HCC) Images pulled up, seen on CT scan.  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 CT CARDIAC SCORING  . EKG 12-Lead  . VAS US CAROTID     Signed, Esmond Plants, M.D., Ph.D. 03/24/2020  [place], [place]

## 2020-03-24 ENCOUNTER — Other Ambulatory Visit: Payer: Self-pay

## 2020-03-24 ENCOUNTER — Encounter: Payer: Self-pay | Admitting: Cardiovascular Disease

## 2020-03-24 ENCOUNTER — Ambulatory Visit
Admission: RE | Admit: 2020-03-24 | Discharge: 2020-03-24 | Disposition: A | Discharge status: Home / Self Care | Payer: Medicare Other | Source: Ambulatory Visit | Attending: Cardiovascular Disease | Admitting: Cardiovascular Disease

## 2020-03-24 ENCOUNTER — Ambulatory Visit (INDEPENDENT_AMBULATORY_CARE_PROVIDER_SITE_OTHER): Payer: Medicare Other | Admitting: Cardiovascular Disease

## 2020-03-24 VITALS — BP 138/82 | HR 47 | Ht 65.5 in | Wt 133.1 lb

## 2020-03-24 DIAGNOSIS — E7841 Elevated Lipoprotein(a): Secondary | ICD-10-CM | POA: Diagnosis not present

## 2020-03-24 DIAGNOSIS — I700 Atherosclerosis of aorta: Secondary | ICD-10-CM | POA: Insufficient documentation

## 2020-03-24 DIAGNOSIS — I059 Rheumatic mitral valve disease, unspecified: Secondary | ICD-10-CM

## 2020-03-24 DIAGNOSIS — E782 Mixed hyperlipidemia: Secondary | ICD-10-CM | POA: Diagnosis not present

## 2020-03-24 MED ORDER — ROSUVASTATIN CALCIUM 10 MG PO TABS: 10.0000 mg | ORAL_TABLET | Freq: Every day | ORAL | 4 refills | Status: DC

## 2020-03-24 NOTE — Patient Instructions (Addendum)
Medication Instructions:  No changes  If you need a refill on your cardiac medications before your next appointment, please call your pharmacy.    Lab work: No new labs needed   If you have labs (blood work) drawn today and your tests are completely normal, you will receive your results only by: Marland Kitchen MyChart Message (if you have MyChart) OR . A paper copy in the mail If you have any lab test that is abnormal or we need to change your treatment, we will call you to review the results.   Testing/Procedures: Carotid ultrasound:  Has aortic atherosclerosis  Your physician has requested that you have a carotid duplex. This test is an ultrasound of the carotid arteries in your neck. It looks at blood flow through these arteries that supply the brain with blood. Allow one hour for this exam. There are no restrictions or special instructions.   CT coronary calcium score: Family hx, aortic atherosclerosis  We will order CT coronary calcium score $154.42 at our [facility] in [place]   Please call [phone] to schedule [facility] [place], [place] [address]   Follow-Up: At [facility], you and your health needs are our priority.  As part of our continuing mission to provide you with exceptional heart care, we have created designated Provider Care Teams.  These Care Teams include your primary Cardiologist (physician) and Advanced Practice Providers (APPs -  Physician Assistants and Nurse Practitioners) who all work together to provide you with the care you need, when you need it.  . You will need a follow up appointment in 12 months  . Providers on your designated Care Team:   . Murray Hodgkins, NP . Christell Faith, PA-C . Marrianne Mood, PA-C  Any Other Special Instructions Will Be Listed Below (If Applicable).  COVID-19 Vaccine Information can be found at:  ShippingScam.co.uk For questions related to vaccine distribution or appointments, please email vaccine@ .com or call 6186894456.

## 2020-04-01 ENCOUNTER — Other Ambulatory Visit: Payer: Self-pay

## 2020-04-01 ENCOUNTER — Ambulatory Visit (INDEPENDENT_AMBULATORY_CARE_PROVIDER_SITE_OTHER): Payer: Medicare Other

## 2020-04-01 DIAGNOSIS — I059 Rheumatic mitral valve disease, unspecified: Secondary | ICD-10-CM

## 2020-04-01 DIAGNOSIS — I700 Atherosclerosis of aorta: Secondary | ICD-10-CM

## 2020-06-12 ENCOUNTER — Encounter: Payer: Self-pay | Admitting: Nurse Practitioner

## 2020-10-17 ENCOUNTER — Other Ambulatory Visit: Payer: Self-pay | Admitting: Internal Medicine

## 2020-10-17 DIAGNOSIS — R971 Elevated cancer antigen 125 [CA 125]: Secondary | ICD-10-CM

## 2020-10-17 DIAGNOSIS — Z1504 Genetic susceptibility to malignant neoplasm of endometrium: Secondary | ICD-10-CM

## 2020-10-17 DIAGNOSIS — N8500 Endometrial hyperplasia, unspecified: Secondary | ICD-10-CM

## 2020-10-22 NOTE — Telephone Encounter (Signed)
Mrs. Kristina Haney probably does not need aspirin Mr. Kristina Haney does need aspirin given prior coronary disease, stenting (always needs antiplatelet therapy)

## 2020-11-25 ENCOUNTER — Ambulatory Visit
Admission: RE | Admit: 2020-11-25 | Discharge: 2020-11-25 | Disposition: A | Discharge status: Home / Self Care | Payer: Medicare Other | Source: Ambulatory Visit | Attending: Internal Medicine | Admitting: Internal Medicine

## 2020-11-25 ENCOUNTER — Other Ambulatory Visit: Payer: Self-pay

## 2020-11-25 DIAGNOSIS — N8500 Endometrial hyperplasia, unspecified: Secondary | ICD-10-CM | POA: Insufficient documentation

## 2020-11-25 DIAGNOSIS — R971 Elevated cancer antigen 125 [CA 125]: Secondary | ICD-10-CM | POA: Diagnosis not present

## 2020-11-25 DIAGNOSIS — Z1504 Genetic susceptibility to malignant neoplasm of endometrium: Secondary | ICD-10-CM | POA: Diagnosis present

## 2021-04-10 ENCOUNTER — Other Ambulatory Visit: Payer: Self-pay

## 2021-04-10 ENCOUNTER — Encounter: Payer: Self-pay | Admitting: Cardiovascular Disease

## 2021-04-10 ENCOUNTER — Ambulatory Visit (INDEPENDENT_AMBULATORY_CARE_PROVIDER_SITE_OTHER): Payer: Medicare Other | Admitting: Cardiovascular Disease

## 2021-04-10 VITALS — BP 140/72 | HR 45 | Ht 65.0 in | Wt 128.5 lb

## 2021-04-10 DIAGNOSIS — I700 Atherosclerosis of aorta: Secondary | ICD-10-CM | POA: Diagnosis not present

## 2021-04-10 DIAGNOSIS — E782 Mixed hyperlipidemia: Secondary | ICD-10-CM

## 2021-04-10 DIAGNOSIS — E7841 Elevated Lipoprotein(a): Secondary | ICD-10-CM | POA: Diagnosis not present

## 2021-04-10 DIAGNOSIS — I059 Rheumatic mitral valve disease, unspecified: Secondary | ICD-10-CM | POA: Diagnosis not present

## 2021-04-10 MED ORDER — ROSUVASTATIN CALCIUM 10 MG PO TABS: 10.0000 mg | ORAL_TABLET | Freq: Every day | ORAL | 4 refills | Status: DC

## 2021-04-10 NOTE — Progress Notes (Signed)
Cardiology Office Note  Date:  04/10/2021   ID:  ALANEY WITTER, DOB [birth year], MRN [id number]  PCP:  Idelle Crouch, MD   Chief Complaint  Patient presents with   12 month follow up     "Doing well." Medications reviewed by the patient verbally.     HPI:  Ms. Kagel is a very pleasant 80 year old woman with a strong family history of coronary artery disease (her father),  coronary calcium score of zero in 2009 and again in early 2014 ,  minimal carotid intimal thickening on carotid ultrasound (done outside the office),   Repeat carotid ultrasound through our office with no intimal thickening  hyperlipidemia , on statin CT scan showing mild, sometimes mild to moderate descending aortic atherosclerosis  Lipoprotein (a) 140s down 119 who presents for routine followup of her hyperlipidemia.  In follow-up today reports feeling well Continues to do regular exercise No chest pain or shortness of breath on exertion  Coronary calcium score performed 2021 score of 0  Review of NMR LipoProfile, numbers aggressively low Continues on Crestor 10  Echocardiogram reviewed 2021, mild MR, images pulled up and reviewed in the office  Carotid ultrasound no significant disease 2019  CT abdomen 2020 showing mild  distal aortic atherosclerosis  Prior diagnosis of rectal prolapse. S/p  surgery to repair her rectal prolapse 12/2009.  Reports having asymptomatic bradycardia  EKG personally reviewed by myself on todays visit Sinus bradycardia rate 45 bpm no significant ST-T wave changes  Other past medical history reviewed Previous abdominal CT scan to rule out neoplastic growth  ( family history of pancreatic cancer) at least mild atherosclerotic plaque in the distal aorta (select areas with mild to moderate disease), minimal plaquing in the right common iliac      PMH:   has a past medical history of Hyperlipidemia, Personal history of unspecified circulatory disease, and Routine general  medical examination at a health care facility.  PSH:    Past Surgical History:  Procedure Laterality Date   APPENDECTOMY  1999    Current Outpatient Medications  Medication Sig Dispense Refill   Ascorbic Acid (VITAMIN C) 1000 MG tablet Take 1,000 mg by mouth daily.     b complex vitamins tablet Take 1 tablet by mouth daily.     Biotin 5000 MCG CAPS Take 5,000 mcg by mouth daily.     Calcium 1500 MG tablet Take 1,500 mg by mouth daily.     Cholecalciferol (VITAMIN D3) 2000 UNITS capsule Take 2,000 Units by mouth daily.       Coenzyme Q10 (CO Q-10 PO) Take by mouth daily.     cyanocobalamin 1000 MCG tablet Take by mouth daily.     Flax OIL Take by mouth daily.      Tretinoin, Facial Wrinkles, (RENOVA PUMP) 0.02 % CREA Apply to face at bedtime     rosuvastatin (CRESTOR) 10 MG tablet Take 1 tablet (10 mg total) by mouth daily. 90 tablet 4   No current facility-administered medications for this visit.    Allergies:   Iodinated diagnostic agents, Bacitracin-neomycin-polymyxin, Bee venom, Iohexol, Zoster vaccine live, and Hydrocortisone   Social History:  The patient  reports that she has never smoked. She has never used smokeless tobacco. She reports current alcohol use of about 1.0 standard drink per week. She reports that she does not use drugs.   Family History:   family history includes Heart Problems in her father and mother; Heart attack in her father; Stroke in  her mother.    Review of Systems: Review of Systems  Constitutional: Negative.   Respiratory: Negative.    Cardiovascular: Negative.   Gastrointestinal: Negative.   Musculoskeletal: Negative.   Neurological: Negative.   Psychiatric/Behavioral: Negative.    All other systems reviewed and are negative.   PHYSICAL EXAM: VS:  BP 140/72 (BP Location: Left Arm, Patient Position: Sitting, Cuff Size: Normal)   Pulse (!) 45   Ht 5\' 5"  (1.651 m)   Wt 128 lb 8 oz (58.3 kg)   SpO2 98%   BMI 21.38 kg/m  , BMI Body mass  index is 21.38 kg/m. Constitutional:  oriented to person, place, and time. No distress.  HENT:  Head: Grossly normal Eyes:  no discharge. No scleral icterus.  Neck: No JVD, no carotid bruits  Cardiovascular: Regular rate and rhythm, no murmurs appreciated Pulmonary/Chest: Clear to auscultation bilaterally, no wheezes or rails Abdominal: Soft.  no distension.  no tenderness.  Musculoskeletal: Normal range of motion Neurological:  normal muscle tone. Coordination normal. No atrophy Skin: Skin warm and dry Psychiatric: normal affect, pleasant  Recent Labs: No results found for requested labs within last 8760 hours.    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 04/10/2021  [place], [place] [phone]

## 2021-04-10 NOTE — Patient Instructions (Signed)
Medication Instructions:  No changes  If you need a refill on your cardiac medications before your next appointment, please call your pharmacy.    Lab work: No new labs needed   If you have labs (blood work) drawn today and your tests are completely normal, you will receive your results only by: MyChart Message (if you have MyChart) OR A paper copy in the mail If you have any lab test that is abnormal or we need to change your treatment, we will call you to review the results.   Testing/Procedures: No new testing needed   Follow-Up: At [facility], you and your health needs are our priority.  As part of our continuing mission to provide you with exceptional heart care, we have created designated Provider Care Teams.  These Care Teams include your primary Cardiologist (physician) and Advanced Practice Providers (APPs -  Physician Assistants and Nurse Practitioners) who all work together to provide you with the care you need, when you need it.  You will need a follow up appointment in 12 months  Providers on your designated Care Team:   Christopher Berge, NP Ryan Dunn, PA-C Jacquelyn Visser, PA-C Cadence Furth, PA-C  Any Other Special Instructions Will Be Listed Below (If Applicable).  COVID-19 Vaccine Information can be found at: [email or web address] For questions related to vaccine distribution or appointments, please email [email or web address] or call [phone].    

## 2021-04-29 ENCOUNTER — Inpatient Hospital Stay: Payer: Medicare Other | Attending: Obstetrics and Gynecology | Admitting: Obstetrics and Gynecology

## 2021-04-29 ENCOUNTER — Other Ambulatory Visit: Payer: Self-pay

## 2021-04-29 DIAGNOSIS — Z9189 Other specified personal risk factors, not elsewhere classified: Secondary | ICD-10-CM

## 2021-04-29 DIAGNOSIS — Z780 Asymptomatic menopausal state: Secondary | ICD-10-CM | POA: Diagnosis not present

## 2021-04-29 DIAGNOSIS — Z8049 Family history of malignant neoplasm of other genital organs: Secondary | ICD-10-CM | POA: Insufficient documentation

## 2021-04-29 DIAGNOSIS — M810 Age-related osteoporosis without current pathological fracture: Secondary | ICD-10-CM | POA: Diagnosis not present

## 2021-04-29 DIAGNOSIS — Z8742 Personal history of other diseases of the female genital tract: Secondary | ICD-10-CM | POA: Insufficient documentation

## 2021-04-29 DIAGNOSIS — Z8262 Family history of osteoporosis: Secondary | ICD-10-CM | POA: Insufficient documentation

## 2021-04-29 DIAGNOSIS — E7841 Elevated Lipoprotein(a): Secondary | ICD-10-CM | POA: Diagnosis not present

## 2021-04-29 DIAGNOSIS — Z800 Family history of malignant neoplasm of digestive organs: Secondary | ICD-10-CM | POA: Insufficient documentation

## 2021-04-29 DIAGNOSIS — R971 Elevated cancer antigen 125 [CA 125]: Secondary | ICD-10-CM | POA: Diagnosis not present

## 2021-04-29 DIAGNOSIS — I700 Atherosclerosis of aorta: Secondary | ICD-10-CM | POA: Diagnosis not present

## 2021-04-29 NOTE — Progress Notes (Signed)
Gynecologic Oncology Consult Visit   Chief Complaint: Endometrial Hyperplasia history, concern for hereditary gyn cancer risk  Subjective:  Kristina Haney is a 80 y.o. female who is seen in consultation from Dr. 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  She has been followed at [facility] by Dr. Fransisca Connors.  She has a strong family history of malignancy with negative genetic testing for Lynch syndrome. She presents today for discussion of recent bloodwork that showed slightly elevated CA125 of 35.2.   She receives yearly blood work with internal medicine.   CA 125   04/23/21 35.2 10/09/20 42.9 07/10/19 41.5 07/05/18 36  CA 19-9 10/09/20 13 07/10/19 11  She has been evaluated by physical therapy for sacral nerve stimulator to improve history of fecal leakage.   History: She has a distant history of endometrial hyperplasia that was treated with Provera.  She has an identical twin sister who developed endometrial cancer while on hormone replacement therapy.  Due to strong family history of osteoporosis she was treated with raloxifine from 19 98-2008.  Her DEXA scans were subsequently stable and improved.  She is never had an abnormal Pap smear.  Pap 07/2011 was normal.  Pap 2017 was normal.  Repeat Pap with cotesting January 2020 was negative.  She was evaluated by genetics but no testing was recommended as her identical twin sister with uterine cancer had negative genetic testing for Lynch syndrome.  Older sister with pancreatic cancer history.  CT abdomen pelvis in August 2020 for unknown reason notable for dilated right renal pelvis.  New diagnosis of degenerative disc disease in 08/2019.  Plans for lumbar spine corticosteroid injections.  She is active and walks 5 to 7 miles per day, elliptical and weights for daily exercise.  She was seen for surveillance with Dr. Fransisca Connors on 09/05/2020 with [facility] with negative exam.  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   History of cardiovascular disorder 09/06/2007    Past Medical History: Past Medical History:  Diagnosis Date   Hyperlipidemia    Personal history of unspecified circulatory disease    Routine general medical examination at a health care facility     Past Surgical History: Past Surgical History:  Procedure Laterality Date   APPENDECTOMY  1999  Rectal prolapse surgery at [facility]  Past Gynecologic History:  Post menopausal   Family History: Family History  Problem Relation Age of Onset   Heart Problems Mother    Stroke Mother    Heart attack Father    Heart Problems Father     Social History: Social History   Socioeconomic History   Marital status: Married    Spouse name: Not on file   Number of children: Not on file   Years of education: Not on file   Highest education level: Not on file  Occupational History   Not on file  Tobacco Use   Smoking status: Never   Smokeless tobacco: Never   Tobacco comments:    tobacco use- no   Vaping Use   Vaping Use: Never used  Substance and Sexual Activity   Alcohol use: Yes    Alcohol/week: 1.0 standard drink    Types: 1  Glasses of wine per week    Comment: 4-6 glass a week    Drug use: No   Sexual activity: Not on file  Other Topics Concern   Not on file  Social History Narrative   Regularly exercises- aerobic/strength/yoga. Housewife    Social Determinants of Health   Financial Resource Strain: Not on file  Food Insecurity: Not on file  Transportation Needs: Not on file  Physical Activity: Not on file  Stress: Not on file  Social Connections: Not on file  Intimate Partner Violence:  Not on file    Allergies: Allergies  Allergen Reactions   Iodinated Diagnostic Agents Rash and Hives    Can tolerate with a steroid prep Can tolerate with a steroid prep Can tolerate with a steroid prep    Bacitracin-Neomycin-Polymyxin Other (See Comments)   Bee Venom Swelling   Iohexol Hives    ivp dye   Zoster Vaccine Live Other (See Comments)   Hydrocortisone Rash    Current Medications: Current Outpatient Medications  Medication Sig Dispense Refill   Ascorbic Acid (VITAMIN C) 1000 MG tablet Take 1,000 mg by mouth daily.     b complex vitamins tablet Take 1 tablet by mouth daily.     Biotin 5000 MCG CAPS Take 5,000 mcg by mouth daily.     Calcium 1500 MG tablet Take 1,500 mg by mouth daily.     Cholecalciferol (VITAMIN D3) 2000 UNITS capsule Take 2,000 Units by mouth daily.       Coenzyme Q10 (CO Q-10 PO) Take by mouth daily.     cyanocobalamin 1000 MCG tablet Take by mouth daily.     Flax OIL Take by mouth daily.      rosuvastatin (CRESTOR) 10 MG tablet Take 1 tablet (10 mg total) by mouth daily. 90 tablet 4   Tretinoin, Facial Wrinkles, (RENOVA PUMP) 0.02 % CREA Apply to face at bedtime     No current facility-administered medications for this visit.   Review of Systems General: negative for fevers, changes in weight or night sweats Skin: negative for changes in moles or sores or rash Eyes: negative for changes in vision HEENT: negative for change in hearing, tinnitus, voice changes Pulmonary: negative for dyspnea, orthopnea, productive cough, wheezing Cardiac: negative for palpitations, pain Gastrointestinal: negative for nausea, vomiting, constipation, diarrhea, hematemesis, hematochezia Genitourinary/Sexual: negative for dysuria, retention, hematuria, incontinence Ob/Gyn:  negative for abnormal bleeding, or pain Musculoskeletal: negative for pain, joint pain, back pain Hematology: negative for easy bruising, abnormal bleeding Neurologic/Psych: negative for  headaches, seizures, paralysis, weakness, numbness   Objective:  Physical Examination:  BP (!) 153/86   Pulse (!) 58   Temp 98.7 F (37.1 C)   Resp 20   Wt 128 lb 6.4 oz (58.2 kg)   SpO2 100%   BMI 21.37 kg/m     ECOG Performance Status: 0 - Asymptomatic  General appearance: alert, cooperative, and appears stated age HEENT: sclera clearno lower extremity edema Skin exam - normal coloration and turgor, no rashes, no suspicious skin lesions noted. Neurological exam reveals alert, oriented, normal speech, no focal findings or movement disorder noted.  Pelvic: EGBUS: no lesions Cervix: no lesions, nontender, mobile Vagina: no lesions, no discharge or bleeding Uterus: normal size, nontender, mobile Adnexa: no palpable masses Rectovaginal: confirmatory  Assessment:  Kristina Haney is a 80 y.o. female diagnosed with history of endometrial hyperplasia. Family history of cancer.  Patient is very concerned about hereditary cancer risk, particularly Gyn cancer.  Reassured her  that she is not at very increased risk and Lynch syndrome gene testing already done and negative.  Pelvic US recently was normal.  CA125 is slightly elevated at 35.2, but this is lower than in the past.    Plan:   Problem List Items Addressed This Visit       Other   At risk for hereditary cancer-predisposing syndrome   We discussed options for management including [facility] MyRisk testing to reassure her further about not being at excessive risk for Gyn and other cancers.  She will pursue this with her internist.    I also gave her information about the GRAIL cell free DNA test that aims to detect all common types of cancer early.  The test is in clinical trials and will be available at [facility] next year, as we are a study site. .  The patient's diagnosis, an outline of the further diagnostic and laboratory studies which will be required, the recommendation for surgery, and alternatives were discussed with her and  her accompanying family members.  All questions were answered to their satisfaction.  A total of 30 minutes were spent with the patient/family today; 90% was spent in education, counseling and coordination of care for discussion of hereditary cancer risk and early cancer detection tests.    Will see her back for routine follow up in 6 months as scheduled.  Verlon Au, NP  I personally interviewed and examined the patient. Agreed with the above/below plan of care. I have directly contributed to assessment and plan of care of this patient and educated and discussed with patient and family.  Mellody Drown, MD  CC:  Doy Hutching Leonie Douglas, MD [facility] [place],  [place] [address] [phone]

## 2021-05-27 ENCOUNTER — Ambulatory Visit: Payer: Medicare Other

## 2022-05-25 ENCOUNTER — Ambulatory Visit: Payer: Medicare Other | Admitting: Cardiovascular Disease

## 2022-06-04 NOTE — Progress Notes (Unsigned)
Cardiology Office Note  Date:  06/07/2022   ID:  Kristina Haney, DOB 1940-06-22, MRN [id number]  PCP:  Kristina Crouch, MD   Chief Complaint  Patient presents with   12 month follow up     "Doing well." Medications reviewed by the patient verbally.     HPI:  Kristina Haney is a very pleasant 81 year old woman with coronary calcium score of zero in 2009 and again in early 2014 , 2021 minimal carotid intimal thickening on carotid ultrasound (done outside the office),   Repeat carotid ultrasound through our office with no intimal thickening  hyperlipidemia , on statin CT scan showing mild, sometimes mild to moderate descending aortic atherosclerosis  Lipoprotein (a) 140s down 119 who presents for routine followup of her hyperlipidemia, aortic atherosclerosis.  Last seen by myself in clinic October 2022 In general reports doing well, continues to perform regular exercise Denies chest pain concerning for angina No significant lower extremity swelling  Total cholesterol continues to run in the 160 range LDL 80s Tolerating low-dose Crestor  Coronary calcium score performed 2021 score of 0  LP(a) 119  Echocardiogram reviewed 2021, mild MR,   Carotid ultrasound no significant disease 2019  CT abdomen 2020 showing mild  distal aortic atherosclerosis  Prior diagnosis of rectal prolapse. S/p  surgery to repair her rectal prolapse 12/2009.  EKG personally reviewed by myself on todays visit Sinus bradycardia rate 54 bpm no significant ST-T wave changes  Other past medical history reviewed Previous abdominal CT scan to rule out neoplastic growth  ( family history of pancreatic cancer) at least mild atherosclerotic plaque in the distal aorta (select areas with mild to moderate disease), minimal plaquing in the right common iliac      PMH:   has a past medical history of Hyperlipidemia, Personal history of unspecified circulatory disease, and Routine general medical examination at a  health care facility.  PSH:    Past Surgical History:  Procedure Laterality Date   APPENDECTOMY  1999    Current Outpatient Medications  Medication Sig Dispense Refill   Ascorbic Acid (VITAMIN C) 1000 MG tablet Take 1,000 mg by mouth daily.     b complex vitamins tablet Take 1 tablet by mouth daily.     Biotin 5000 MCG CAPS Take 5,000 mcg by mouth daily.     Calcium 1500 MG tablet Take 1,500 mg by mouth daily.     Cholecalciferol (VITAMIN D3) 2000 UNITS capsule Take 2,000 Units by mouth daily.       Coenzyme Q10 (CO Q-10 PO) Take by mouth daily.     cyanocobalamin 1000 MCG tablet Take by mouth daily.     ezetimibe (ZETIA) 10 MG tablet Take 1 tablet (10 mg total) by mouth daily. 90 tablet 3   Flax OIL Take by mouth daily.      Lactobacillus (PROBIOTIC ACIDOPHILUS PO) Take 7.5 mg by mouth daily.     Tretinoin, Facial Wrinkles, (RENOVA PUMP) 0.02 % CREA Apply to face at bedtime     TURMERIC PO Take 2,000 mg by mouth daily.     rosuvastatin (CRESTOR) 10 MG tablet Take 1 tablet (10 mg total) by mouth daily. 90 tablet 3   No current facility-administered medications for this visit.    Allergies:   Iodinated contrast media, Bacitracin-neomycin-polymyxin, Bee venom, Iohexol, Zoster vaccine live, and Hydrocortisone   Social History:  The patient  reports that she has never smoked. She has never used smokeless tobacco. She reports current alcohol  use of about 1.0 standard drink of alcohol per week. She reports that she does not use drugs.   Family History:   family history includes Heart Problems in her father and mother; Heart attack in her father; Stroke in her mother.    Review of Systems: Review of Systems  Constitutional: Negative.   Respiratory: Negative.    Cardiovascular: Negative.   Gastrointestinal: Negative.   Musculoskeletal: Negative.   Neurological: Negative.   Psychiatric/Behavioral: Negative.    All other systems reviewed and are negative.   PHYSICAL EXAM: VS:  BP  138/80 (BP Location: Left Arm, Patient Position: Sitting, Cuff Size: Normal)   Pulse (!) 54   Ht 5' 5.5" (1.664 m)   Wt 131 lb 6 oz (59.6 kg)   SpO2 98%   BMI 21.53 kg/m  , BMI Body mass index is 21.53 kg/m. Constitutional:  oriented to person, place, and time. No distress.  HENT:  Head: Grossly normal Eyes:  no discharge. No scleral icterus.  Neck: No JVD, no carotid bruits  Cardiovascular: Regular rate and rhythm, no murmurs appreciated Pulmonary/Chest: Clear to auscultation bilaterally, no wheezes or rails Abdominal: Soft.  no distension.  no tenderness.  Musculoskeletal: Normal range of motion Neurological:  normal muscle tone. Coordination normal. No atrophy Skin: Skin warm and dry Psychiatric: normal affect, pleasant  Recent Labs: No results found for requested labs within last 365 days.    Lipid Panel Lab Results  Component Value Date   CHOL 198 10/14/2008   HDL 71.0 10/14/2008   LDLCALC 114.2 10/14/2008   TRIG 64 10/14/2008      Wt Readings from Last 3 Encounters:  06/07/22 131 lb 6 oz (59.6 kg)  04/29/21 128 lb 6.4 oz (58.2 kg)  04/10/21 128 lb 8 oz (58.3 kg)     ASSESSMENT AND PLAN:  Abdominal aortic atherosclerosis (HCC) Mild aortic atherosclerosis, images pulled up and reviewed on today's visit Exercising, cholesterol slightly above goal Recommend she continue Crestor, add Zetia 10 mg daily to achieve goal LDL less than 60  Mixed hyperlipidemia Continue Crestor 10 mg daily, we will add Zetia Lab work with primary care in 6 months  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, familial On Crestor, will add Zetia as above  Preventive care CT studies showing no progression in coronary calcification There is mild aortic atherosclerosis Denies anginal symptoms, non-smoker, no diabetes, low cholesterol Cholesterol management as above  Osteoporosis Managed by primary care  Mitral valve regurgitation  mild on echo in 2021 No further work-up needed, no murmur    Total encounter time more than 30 minutes  Greater than 50% was spent in counseling and coordination of care with the patient     Orders Placed This Encounter  Procedures   EKG 12-Lead      Signed, Esmond Plants, M.D., Ph.D. 06/07/2022  [place], [place]

## 2022-06-07 ENCOUNTER — Encounter: Payer: Self-pay | Admitting: Cardiovascular Disease

## 2022-06-07 ENCOUNTER — Ambulatory Visit: Payer: Medicare Other | Attending: Cardiovascular Disease | Admitting: Cardiovascular Disease

## 2022-06-07 VITALS — BP 138/80 | HR 54 | Ht 65.5 in | Wt 131.4 lb

## 2022-06-07 DIAGNOSIS — E782 Mixed hyperlipidemia: Secondary | ICD-10-CM | POA: Insufficient documentation

## 2022-06-07 DIAGNOSIS — I059 Rheumatic mitral valve disease, unspecified: Secondary | ICD-10-CM | POA: Diagnosis present

## 2022-06-07 DIAGNOSIS — I700 Atherosclerosis of aorta: Secondary | ICD-10-CM | POA: Insufficient documentation

## 2022-06-07 DIAGNOSIS — E7841 Elevated Lipoprotein(a): Secondary | ICD-10-CM | POA: Insufficient documentation

## 2022-06-07 MED ORDER — EZETIMIBE 10 MG PO TABS: 10.0000 mg | ORAL_TABLET | Freq: Every day | ORAL | 3 refills | Status: DC

## 2022-06-07 MED ORDER — ROSUVASTATIN CALCIUM 10 MG PO TABS: 10.0000 mg | ORAL_TABLET | Freq: Every day | ORAL | 3 refills | Status: DC

## 2022-06-07 NOTE — Patient Instructions (Addendum)
Medication Instructions:  Zetia 10 mg daily  If you need a refill on your cardiac medications before your next appointment, please call your pharmacy.   Lab work: No new labs needed  Testing/Procedures: No new testing needed  Follow-Up: At [facility], you and your health needs are our priority.  As part of our continuing mission to provide you with exceptional heart care, we have created designated Provider Care Teams.  These Care Teams include your primary Cardiologist (physician) and Advanced Practice Providers (APPs -  Physician Assistants and Nurse Practitioners) who all work together to provide you with the care you need, when you need it.  You will need a follow up appointment in 12 months  Providers on your designated Care Team:   Murray Hodgkins, NP Christell Faith, PA-C Cadence Kathlen Mody, [place]  COVID-19 Vaccine Information can be found at: [email or web address] For questions related to vaccine distribution or appointments, please email [email or web address] or call [phone].

## 2022-07-29 ENCOUNTER — Encounter: Payer: Self-pay | Admitting: Cardiovascular Disease

## 2022-08-27 ENCOUNTER — Telehealth: Payer: Self-pay | Admitting: Cardiovascular Disease

## 2022-08-27 NOTE — Telephone Encounter (Signed)
Pt called in stating that about 2-3 wks ago, she had a mammo heart evaluation at [facility] in [place]. She just got a letter today stating that during this mammogram + mammo heart eval, it indicated that there could be risk for heart disease and suggested that she see her cardiologist to get further testing. Pt is worried about this because of the heart disease that runs in her family.   Advised the pt of Dr. Donivan Scull first available, May 21st. She stated she really doesn't want to wait that long because of family hx. Offered to schedule with a APP, pt insisted on seeing Dr. Rockey Situ. Offered to schedule pt on 11/09/22, put on wait list for a sooner availability, and that I would send a message stating the above, that made the pt feel better.

## 2022-09-17 ENCOUNTER — Encounter: Payer: Self-pay | Admitting: Cardiovascular Disease

## 2022-10-14 ENCOUNTER — Encounter: Payer: Self-pay | Admitting: Cardiovascular Disease

## 2022-10-20 ENCOUNTER — Inpatient Hospital Stay: Payer: Medicare Other

## 2022-11-09 ENCOUNTER — Ambulatory Visit: Payer: Medicare Other | Admitting: Cardiovascular Disease

## 2022-12-27 ENCOUNTER — Ambulatory Visit: Payer: Medicare Other | Admitting: Cardiovascular Disease

## 2023-06-03 ENCOUNTER — Other Ambulatory Visit: Payer: Self-pay | Admitting: Cardiovascular Disease

## 2023-06-26 NOTE — Progress Notes (Signed)
 Cardiology Office Note  Date:  06/27/2023   ID:  DELBERT VU, DOB 28-Oct-1940, MRN [id number]  PCP:  Auston Reyes BIRCH, MD   Chief Complaint  Patient presents with   12 month follow up     Doing well.     HPI:  Kristina Haney is a very pleasant 83 year old woman with coronary calcium  score of zero in 2009 and again in early 2014 , 2021 minimal carotid intimal thickening on carotid ultrasound (done outside the office),   Repeat carotid ultrasound through our office with no intimal thickening  hyperlipidemia , on statin CT scan showing mild, sometimes mild to moderate descending aortic atherosclerosis  Lipoprotein (a) 140s down 119 who presents for routine followup of her hyperlipidemia, aortic atherosclerosis.  Last seen by myself in clinic 12/23  In follow-up today reports she feels well Denies chest pain or shortness of breath concerning for angina No lower extremity edema Continues to exercise on a regular basis  Continues on Crestor  10 Zetia  10  Coronary calcium  score performed 2021 score of 0 unchanged from 2009  Lab work reviewed LP(a) 119 Total cholesterol 154 LDL 63 LDL particle #707, LDL 74 Total cholesterol 167  Echocardiogram reviewed 2021, mild MR,   Carotid ultrasound no significant disease 2019  CT abdomen 2020 showing mild  distal aortic atherosclerosis  Prior diagnosis of rectal prolapse. S/p  surgery to repair her rectal prolapse 12/2009.  EKG personally reviewed by myself on todays visit EKG Interpretation Date/Time:  Monday June 27 2023 14:52:24 EST Ventricular Rate:  55 PR Interval:  136 QRS Duration:  90 QT Interval:  432 QTC Calculation: 413 R Axis:   -2  Text Interpretation: Sinus bradycardia When compared with ECG of 24-Mar-2020 10:37, No significant change was found Confirmed by Perla Lye [phone]) on 06/27/2023 3:18:30 PM   Other past medical history reviewed Previous abdominal CT scan to rule out neoplastic growth  ( family  history of pancreatic cancer) at least mild atherosclerotic plaque in the distal aorta (select areas with mild to moderate disease), minimal plaquing in the right common iliac      PMH:   has a past medical history of Hyperlipidemia, Personal history of unspecified circulatory disease, and Routine general medical examination at a health care facility.  PSH:    Past Surgical History:  Procedure Laterality Date   APPENDECTOMY  1999    Current Outpatient Medications  Medication Sig Dispense Refill   Ascorbic Acid (VITAMIN C) 1000 MG tablet Take 1,000 mg by mouth daily.     b complex vitamins tablet Take 1 tablet by mouth daily.     Biotin 5000 MCG CAPS Take 5,000 mcg by mouth daily.     Calcium  1500 MG tablet Take 1,500 mg by mouth daily.     Cholecalciferol (VITAMIN D3) 2000 UNITS capsule Take 2,000 Units by mouth daily.       Coenzyme Q10 (CO Q-10 PO) Take by mouth daily.     cyanocobalamin 1000 MCG tablet Take by mouth daily.     ezetimibe  (ZETIA ) 10 MG tablet Take 1 tablet (10 mg total) by mouth daily. 90 tablet 3   Flax OIL Take by mouth daily.      Lactobacillus (PROBIOTIC ACIDOPHILUS PO) Take 7.5 mg by mouth daily.     rosuvastatin  (CRESTOR ) 10 MG tablet Take 1 tablet (10 mg total) by mouth daily. 90 tablet 3   Tretinoin, Facial Wrinkles, (RENOVA PUMP) 0.02 % CREA Apply to face at bedtime  TURMERIC PO Take 2,000 mg by mouth daily.     No current facility-administered medications for this visit.    Allergies:   Iodinated contrast media, Bacitracin-neomycin-polymyxin, Bee venom, Iohexol , Zoster vaccine live, and Hydrocortisone   Social History:  The patient  reports that she has never smoked. She has never used smokeless tobacco. She reports current alcohol use of about 1.0 standard drink of alcohol per week. She reports that she does not use drugs.   Family History:   family history includes Heart Problems in her father and mother; Heart attack in her father; Stroke in her  mother.    Review of Systems: Review of Systems  Constitutional: Negative.   Respiratory: Negative.    Cardiovascular: Negative.   Gastrointestinal: Negative.   Musculoskeletal: Negative.   Neurological: Negative.   Psychiatric/Behavioral: Negative.    All other systems reviewed and are negative.   PHYSICAL EXAM: VS:  BP 110/66   Pulse (!) 55   Ht 5' 5 (1.651 m)   Wt 128 lb 8 oz (58.3 kg)   SpO2 96%   BMI 21.38 kg/m  , BMI Body mass index is 21.38 kg/m. Constitutional:  oriented to person, place, and time. No distress.  HENT:  Head: Grossly normal Eyes:  no discharge. No scleral icterus.  Neck: No JVD, no carotid bruits  Cardiovascular: Regular rate and rhythm, no murmurs appreciated Pulmonary/Chest: Clear to auscultation bilaterally, no wheezes or rails Abdominal: Soft.  no distension.  no tenderness.  Musculoskeletal: Normal range of motion Neurological:  normal muscle tone. Coordination normal. No atrophy Skin: Skin warm and dry Psychiatric: normal affect, pleasant  Recent Labs: No results found for requested labs within last 365 days.    Lipid Panel Lab Results  Component Value Date   CHOL 198 10/14/2008   HDL 71.0 10/14/2008   LDLCALC 114.2 10/14/2008   TRIG 64 10/14/2008      Wt Readings from Last 3 Encounters:  06/27/23 128 lb 8 oz (58.3 kg)  06/07/22 131 lb 6 oz (59.6 kg)  04/29/21 128 lb 6.4 oz (58.2 kg)     ASSESSMENT AND PLAN:  Abdominal aortic atherosclerosis (HCC) Mild aortic atherosclerosis, Exercising on a regular basis,  Reasonably well-controlled cholesterol recommend she continue Crestor  10 daily with Zetia  10 daily  Mixed hyperlipidemia Continue Crestor  10 mg daily, with Zetia  10 Lab work on annual basis  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 06/27/2023  [facility] [place], [place] [phone]

## 2023-06-27 ENCOUNTER — Encounter: Payer: Self-pay | Admitting: Cardiovascular Disease

## 2023-06-27 ENCOUNTER — Ambulatory Visit: Payer: Medicare Other | Attending: Cardiovascular Disease | Admitting: Cardiovascular Disease

## 2023-06-27 VITALS — BP 110/66 | HR 55 | Ht 65.0 in | Wt 128.5 lb

## 2023-06-27 DIAGNOSIS — E782 Mixed hyperlipidemia: Secondary | ICD-10-CM | POA: Diagnosis present

## 2023-06-27 DIAGNOSIS — E7841 Elevated Lipoprotein(a): Secondary | ICD-10-CM | POA: Diagnosis present

## 2023-06-27 DIAGNOSIS — I700 Atherosclerosis of aorta: Secondary | ICD-10-CM | POA: Insufficient documentation

## 2023-06-27 DIAGNOSIS — I059 Rheumatic mitral valve disease, unspecified: Secondary | ICD-10-CM | POA: Insufficient documentation

## 2023-06-27 MED ORDER — EZETIMIBE 10 MG PO TABS: 10.0000 mg | ORAL_TABLET | Freq: Every day | ORAL | 3 refills | Status: DC

## 2023-06-27 MED ORDER — ROSUVASTATIN CALCIUM 10 MG PO TABS: 10.0000 mg | ORAL_TABLET | Freq: Every day | ORAL | 3 refills | Status: DC

## 2023-06-27 NOTE — Patient Instructions (Signed)

## 2024-05-28 ENCOUNTER — Ambulatory Visit: Admitting: Cardiovascular Disease

## 2024-07-03 ENCOUNTER — Other Ambulatory Visit: Payer: Self-pay | Admitting: Internal Medicine

## 2024-07-03 DIAGNOSIS — R1032 Left lower quadrant pain: Secondary | ICD-10-CM

## 2024-07-03 DIAGNOSIS — R634 Abnormal weight loss: Secondary | ICD-10-CM

## 2024-07-09 NOTE — Progress Notes (Unsigned)
 Cardiology Office Note  Date:  07/10/2024   ID:  Kristina, Haney 09-10-1940, MRN [id number]  PCP:  Auston Reyes BIRCH, MD   Chief Complaint  Patient presents with   12 month follow up     Patient c/o night sweats for the past several months.     HPI:  [name] is a very pleasant 84 year old woman with coronary calcium  score of zero in 2009 and again in early 2014 , 2021 minimal carotid intimal thickening on carotid ultrasound (done outside the office),   Repeat carotid ultrasound through our office with no intimal thickening  hyperlipidemia , on statin CT scan showing mild, sometimes mild to moderate descending aortic atherosclerosis  Lipoprotein (a) 140s down 119 who presents for routine followup of her hyperlipidemia, aortic atherosclerosis.  Last seen by myself in clinic 2025-07-04  Husband passed away Jan 17, 2026Infection, gall bladder,  sepsis Happened very quickly Supportive family, still adjusting  Reports having night sweats of unclear etiology, no fever Denies any infection she is aware of No new recent medications No chest pain on exertion, continues to workout at the gym 3 days a week  Echo 9/25 NORMAL LEFT VENTRICULAR SYSTOLIC FUNCTION WITH MILD LVH  ESTIMATED EF: >55%  NORMAL LA PRESSURES WITH DIASTOLIC DYSFUNCTION (GRADE 1)  NORMAL RIGHT VENTRICULAR SYSTOLIC FUNCTION  VALVULAR REGURGITATION: MILD AR, TRIVIAL MR, TRIVIAL PR, MILD TR  ESTIMATED RVSP: 19 mmHg (Normal)  NO VALVULAR STENOSIS   Continues on Crestor  10 Zetia  10  Coronary calcium  score performed 2021 score of 0 unchanged from 2009  EKG personally reviewed by myself on todays visit EKG Interpretation Date/Time:  Tuesday July 10 2024 16:34:26 EST Ventricular Rate:  55 PR Interval:  140 QRS Duration:  88 QT Interval:  428 QTC Calculation: 409 R Axis:   -6  Text Interpretation: Sinus bradycardia Possible Left atrial enlargement When compared with ECG of 27-Jun-2023 14:52, No significant  change was found Confirmed by Perla Lye [phone]) on 07/10/2024 5:12:32 PM    Lab work reviewed LP(a) 119 Total cholesterol 154 LDL 63 LDL particle #707, LDL 74 Total cholesterol 167  Echocardiogram reviewed 2021, mild MR,   Carotid ultrasound no significant disease 2019  CT abdomen 2020 showing mild  distal aortic atherosclerosis  Prior diagnosis of rectal prolapse. S/p  surgery to repair her rectal prolapse 12/2009.  Previous abdominal CT scan to rule out neoplastic growth  ( family history of pancreatic cancer) at least mild atherosclerotic plaque in the distal aorta (select areas with mild to moderate disease), minimal plaquing in the right common iliac      PMH:   has a past medical history of Hyperlipidemia, Personal history of unspecified circulatory disease, and Routine general medical examination at a health care facility.  PSH:    Past Surgical History:  Procedure Laterality Date   APPENDECTOMY  1999    Current Outpatient Medications  Medication Sig Dispense Refill   Ascorbic Acid (VITAMIN C) 1000 MG tablet Take 1,000 mg by mouth daily.     b complex vitamins tablet Take 1 tablet by mouth daily.     Calcium  1500 MG tablet Take 1,500 mg by mouth daily.     Cholecalciferol (VITAMIN D3) 2000 UNITS capsule Take 2,000 Units by mouth daily.       Coenzyme Q10 (CO Q-10 PO) Take by mouth daily.     cyanocobalamin 1000 MCG tablet Take by mouth daily.     ezetimibe  (ZETIA ) 10 MG tablet Take 1 tablet (  10 mg total) by mouth daily. 90 tablet 3   Flax OIL Take by mouth daily.      Lactobacillus (PROBIOTIC ACIDOPHILUS PO) Take 7.5 mg by mouth daily.     rosuvastatin  (CRESTOR ) 10 MG tablet Take 1 tablet (10 mg total) by mouth daily. 90 tablet 3   Tretinoin, Facial Wrinkles, (RENOVA PUMP) 0.02 % CREA Apply to face at bedtime     No current facility-administered medications for this visit.    Allergies:   Iodinated contrast media, Bacitra-neomycin-polymyxin-hc,  Bacitracin-neomycin-polymyxin, Bee venom, Iohexol , Zoster vaccine live, and Hydrocortisone   Social History:  The patient  reports that she has never smoked. She has never used smokeless tobacco. She reports current alcohol use of about 1.0 standard drink of alcohol per week. She reports that she does not use drugs.   Family History:   family history includes Heart Problems in her father and mother; Heart attack in her father; Stroke in her mother.    Review of Systems: Review of Systems  Constitutional: Negative.   Respiratory: Negative.    Cardiovascular: Negative.   Gastrointestinal: Negative.   Musculoskeletal: Negative.   Neurological: Negative.   Psychiatric/Behavioral: Negative.    All other systems reviewed and are negative.   PHYSICAL EXAM: VS:  BP 120/80 (BP Location: Left Arm, Patient Position: Sitting, Cuff Size: Normal)   Pulse (!) 55   Ht 5' 5 (1.651 m)   Wt 126 lb 4 oz (57.3 kg)   SpO2 98%   BMI 21.01 kg/m  , BMI Body mass index is 21.01 kg/m. Constitutional:  oriented to person, place, and time. No distress.  HENT:  Head: Grossly normal Eyes:  no discharge. No scleral icterus.  Neck: No JVD, no carotid bruits  Cardiovascular: Regular rate and rhythm, no murmurs appreciated Pulmonary/Chest: Clear to auscultation bilaterally, no wheezes or rales Abdominal: Soft.  no distension.  no tenderness.  Musculoskeletal: Normal range of motion Neurological:  normal muscle tone. Coordination normal. No atrophy Skin: Skin warm and dry Psychiatric: normal affect, pleasant  Recent Labs: No results found for requested labs within last 365 days.    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 07/10/2024  [facility] [place], [place] [phone]

## 2024-07-10 ENCOUNTER — Ambulatory Visit: Attending: Cardiovascular Disease | Admitting: Cardiovascular Disease

## 2024-07-10 ENCOUNTER — Encounter: Payer: Self-pay | Admitting: Cardiovascular Disease

## 2024-07-10 VITALS — BP 120/80 | HR 55 | Ht 65.0 in | Wt 126.2 lb

## 2024-07-10 DIAGNOSIS — I700 Atherosclerosis of aorta: Secondary | ICD-10-CM | POA: Insufficient documentation

## 2024-07-10 DIAGNOSIS — E782 Mixed hyperlipidemia: Secondary | ICD-10-CM | POA: Diagnosis not present

## 2024-07-10 DIAGNOSIS — Z8679 Personal history of other diseases of the circulatory system: Secondary | ICD-10-CM | POA: Diagnosis not present

## 2024-07-10 DIAGNOSIS — E7841 Elevated Lipoprotein(a): Secondary | ICD-10-CM | POA: Diagnosis not present

## 2024-07-10 MED ORDER — EZETIMIBE 10 MG PO TABS: 10.0000 mg | ORAL_TABLET | Freq: Every day | ORAL | 3 refills | Status: AC

## 2024-07-10 MED ORDER — ROSUVASTATIN CALCIUM 10 MG PO TABS: 10.0000 mg | ORAL_TABLET | Freq: Every day | ORAL | 3 refills | Status: AC

## 2024-07-10 NOTE — Patient Instructions (Signed)
 Medication Instructions:  No changes  If you need a refill on your cardiac medications before your next appointment, please call your pharmacy.    Lab work: No new labs needed   Testing/Procedures: No new testing needed   Follow-Up: At [facility], you and your health needs are our priority.  As part of our continuing mission to provide you with exceptional heart care, we have created designated Provider Care Teams.  These Care Teams include your primary Cardiologist (physician) and Advanced Practice Providers (APPs -  Physician Assistants and Nurse Practitioners) who all work together to provide you with the care you need, when you need it.  You will need a follow up appointment in 6 months  Providers on your designated Care Team:   Lonni Meager, NP Bernardino Bring, PA-C Cadence [place], [place]  COVID-19 Vaccine Information can be found at: [email or web address] For questions related to vaccine distribution or appointments, please email [email or web address] or call [phone].

## 2024-07-12 ENCOUNTER — Ambulatory Visit
Admission: RE | Admit: 2024-07-12 | Discharge: 2024-07-12 | Disposition: A | Discharge status: Home / Self Care | Source: Ambulatory Visit | Attending: Internal Medicine | Admitting: Internal Medicine

## 2024-07-12 DIAGNOSIS — R1032 Left lower quadrant pain: Secondary | ICD-10-CM | POA: Insufficient documentation

## 2024-07-12 DIAGNOSIS — R634 Abnormal weight loss: Secondary | ICD-10-CM | POA: Diagnosis present

## 2024-07-12 MED ORDER — IOHEXOL 300 MG/ML  SOLN
100.0000 mL | Freq: Once | INTRAMUSCULAR | Status: AC | PRN
  Administered 2024-07-12: 100 mL via INTRAVENOUS
